# Patient Record
Sex: Female | Born: 2000
Health system: Southern US, Community
[De-identification: ages and names within clinical notes are randomized; demographics above are authoritative.]

## PROBLEM LIST (undated history)

## (undated) ENCOUNTER — Inpatient Hospital Stay (HOSPITAL_COMMUNITY): Payer: Self-pay

## (undated) DIAGNOSIS — IMO0002 Reserved for concepts with insufficient information to code with codable children: Secondary | ICD-10-CM

## (undated) DIAGNOSIS — Z68.41 Body mass index (BMI) pediatric, greater than or equal to 95th percentile for age: Secondary | ICD-10-CM

## (undated) HISTORY — PX: NO PAST SURGERIES: SHX2092

---
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
## 2018-03-18 ENCOUNTER — Telehealth: Payer: Self-pay | Admitting: *Deleted

## 2018-03-18 NOTE — Telephone Encounter (Signed)
Melissa -- please advise?  Copied from CRM [id number]. Topic: Appointment Scheduling - Scheduling Inquiry for Clinic >> Mar 18, 2018 10:04 AM Herby Abraham C wrote: Reason for CRM: Reason for CRM: pt's mom called in to schedule an established care apt with [facility] on 04/21/18, she would like to know if Melissa could see both of her daughters (also Lelon Mast, CRM also sent) She would like to know if provider could get them both established on the same day? I did advise that provider only see 1 np a day, please assist with scheduling.   Pt is looking at 04/21/18 at 11:20 and 11:40a.  Mom's name is Alvis Lemmings [phone]    Mom says that Lelon Mast also has a concern with her toe. Informed that I would make provider aware.

## 2018-03-31 ENCOUNTER — Encounter (HOSPITAL_COMMUNITY): Payer: Self-pay | Admitting: Emergency Medicine

## 2018-03-31 ENCOUNTER — Telehealth (HOSPITAL_COMMUNITY): Payer: Self-pay | Admitting: Emergency Medicine

## 2018-03-31 ENCOUNTER — Ambulatory Visit (HOSPITAL_COMMUNITY)
Admission: EM | Admit: 2018-03-31 | Discharge: 2018-03-31 | Disposition: A | Discharge status: Home / Self Care | Payer: 59 | Attending: Emergency Medicine | Admitting: Emergency Medicine

## 2018-03-31 ENCOUNTER — Ambulatory Visit (INDEPENDENT_AMBULATORY_CARE_PROVIDER_SITE_OTHER): Payer: 59

## 2018-03-31 DIAGNOSIS — R197 Diarrhea, unspecified: Secondary | ICD-10-CM

## 2018-03-31 DIAGNOSIS — J189 Pneumonia, unspecified organism: Secondary | ICD-10-CM

## 2018-03-31 DIAGNOSIS — R0602 Shortness of breath: Secondary | ICD-10-CM | POA: Diagnosis not present

## 2018-03-31 DIAGNOSIS — R0981 Nasal congestion: Secondary | ICD-10-CM

## 2018-03-31 DIAGNOSIS — R05 Cough: Secondary | ICD-10-CM

## 2018-03-31 HISTORY — DX: Body mass index (BMI) pediatric, greater than or equal to 95th percentile for age: Z68.54

## 2018-03-31 HISTORY — DX: Reserved for concepts with insufficient information to code with codable children: IMO0002

## 2018-03-31 LAB — POCT I-STAT, CHEM 8
BUN: 8 mg/dL (ref 6–20)
CALCIUM ION: 1.2 mmol/L (ref 1.15–1.40)
CHLORIDE: 106 mmol/L (ref 101–111)
Creatinine, Ser: 0.8 mg/dL (ref 0.44–1.00)
Glucose, Bld: 92 mg/dL (ref 65–99)
HCT: 44 % (ref 36.0–46.0)
Hemoglobin: 15 g/dL (ref 12.0–15.0)
Potassium: 4.1 mmol/L (ref 3.5–5.1)
SODIUM: 140 mmol/L (ref 135–145)
TCO2: 22 mmol/L (ref 22–32)

## 2018-03-31 MED ORDER — AZITHROMYCIN 500 MG PO TABS: 500.0000 mg | ORAL_TABLET | Freq: Every day | ORAL | 0 refills | Status: AC

## 2018-03-31 MED ORDER — AEROCHAMBER PLUS MISC: 2 refills | Status: DC

## 2018-03-31 MED ORDER — IBUPROFEN 600 MG PO TABS: 600.0000 mg | ORAL_TABLET | Freq: Four times a day (QID) | ORAL | 0 refills | Status: DC | PRN

## 2018-03-31 MED ORDER — PREDNISONE 20 MG PO TABS
60.0000 mg | ORAL_TABLET | Freq: Once | ORAL | Status: AC
  Administered 2018-03-31: 60 mg via ORAL

## 2018-03-31 MED ORDER — DIPHENOXYLATE-ATROPINE 2.5-0.025 MG PO TABS: 1.0000 | ORAL_TABLET | Freq: Four times a day (QID) | ORAL | 0 refills | Status: DC | PRN

## 2018-03-31 MED ORDER — ALBUTEROL SULFATE HFA 108 (90 BASE) MCG/ACT IN AERS: 2.0000 | INHALATION_SPRAY | RESPIRATORY_TRACT | 0 refills | Status: DC | PRN

## 2018-03-31 MED ORDER — IPRATROPIUM-ALBUTEROL 0.5-2.5 (3) MG/3ML IN SOLN
3.0000 mL | Freq: Once | RESPIRATORY_TRACT | Status: AC
  Administered 2018-03-31: 3 mL via RESPIRATORY_TRACT

## 2018-03-31 MED ORDER — PREDNISONE 20 MG PO TABS: 40.0000 mg | ORAL_TABLET | Freq: Every day | ORAL | 0 refills | Status: AC

## 2018-03-31 MED ORDER — PREDNISONE 20 MG PO TABS
ORAL_TABLET | ORAL | Status: AC
  Filled 2018-03-31: qty 3

## 2018-03-31 MED ORDER — IPRATROPIUM-ALBUTEROL 0.5-2.5 (3) MG/3ML IN SOLN
RESPIRATORY_TRACT | Status: AC
  Filled 2018-03-31: qty 3

## 2018-03-31 NOTE — ED Provider Notes (Addendum)
HPI  SUBJECTIVE:  Jody Reed is a 17 y.o. female who presents with 2 complaints.  First she reports a 2-1/2 to 3-week cough productive of green phlegm.  She reports wheezing, chest pain and soreness secondary to the cough.  She reports shortness of breath starting this morning.  States that she feels like she cannot get a full breath of air in.  She reports nasal congestion and rhinorrhea worse at night.  She reports postnasal drip and sinus pressure with lying down.  No sinus pain.  No allergy, GERD symptoms.  No antibiotics in the past month.  No antipyretic in the past 6 to 8 hours.  She has tried over-the-counter meds including Mucinex and Robitussin.  No phenylephrine-containing products.  No alleviating factors.  Symptoms are worse in the morning and with exertion.  Second, she reports watery, nonbloody diarrhea about 10 episodes a day for the past 3 days.  She reports an 8 pound weight loss.  No nausea, vomiting, fevers.  She reports mild crampy diffuse gassy abdominal pain before having a bowel movement which resolves afterwards.  No abdominal distention.  No lightheadedness, dizziness.  No raw or undercooked foods, questionable leftovers, recent travel, camping, antibiotics in the past month.  No contacts with similar symptoms.  The diarrhea is not associated with eating.  She tried bland foods without improvement in her symptoms.  No aggravating factors.  Past medical history negative for asthma, emphysema, COPD, smoking, diabetes, abdominal surgeries, hypertension, ulcerative colitis, irritable bowel syndrome, Crohn's.  LMP: Now.  PMD:O'Sullivan, Efraim KaufmannMelissa, NP   Past Medical History:  Diagnosis Date  . BMI (body mass index), pediatric, 95-99% for age     History reviewed. No pertinent surgical history.  Family History  Problem Relation Age of Onset  . Vision loss Father   . Diabetes Maternal Grandmother   . Heart disease Maternal Grandmother   . Hyperlipidemia Maternal Grandmother    . Hypertension Maternal Grandmother   . Miscarriages / Stillbirths Maternal Grandmother   . Stroke Maternal Grandfather   . Diabetes Paternal Grandfather   . Heart disease Paternal Grandfather   . Hyperlipidemia Paternal Grandfather   . Hypertension Paternal Grandfather   . Asthma Paternal Uncle   . Alcohol abuse Neg Hx   . Arthritis Neg Hx   . Birth defects Neg Hx   . Cancer Neg Hx   . COPD Neg Hx   . Depression Neg Hx   . Drug abuse Neg Hx   . Early death Neg Hx   . Hearing loss Neg Hx   . Kidney disease Neg Hx   . Learning disabilities Neg Hx   . Mental illness Neg Hx   . Mental retardation Neg Hx   . Varicose Veins Neg Hx     Social History   Tobacco Use  . Smoking status: Never Smoker  . Smokeless tobacco: Never Used  Substance Use Topics  . Alcohol use: No  . Drug use: No    No current facility-administered medications for this encounter.   Current Outpatient Medications:  .  azithromycin (ZITHROMAX) 500 MG tablet, Take 1 tablet (500 mg total) by mouth daily for 5 days., Disp: 5 tablet, Rfl: 0 .  diphenoxylate-atropine (LOMOTIL) 2.5-0.025 MG tablet, Take 1 tablet by mouth 4 (four) times daily as needed for diarrhea or loose stools., Disp: 30 tablet, Rfl: 0 .  ibuprofen (ADVIL,MOTRIN) 600 MG tablet, Take 1 tablet (600 mg total) by mouth every 6 (six) hours as needed., Disp:  30 tablet, Rfl: 0 .  predniSONE (DELTASONE) 20 MG tablet, Take 2 tablets (40 mg total) by mouth daily with breakfast for 5 days., Disp: 10 tablet, Rfl: 0 .  Spacer/Aero-Holding Chambers (AEROCHAMBER PLUS) inhaler, Use as instructed, Disp: 1 each, Rfl: 2  No Known Allergies   ROS  As noted in HPI.   Physical Exam  BP 120/76   Pulse 68   Temp 98.1 F (36.7 C) (Oral)   Resp 18   Ht 5\' 6"  (1.676 m)   Wt 225 lb (102.1 kg)   LMP 03/27/2018   SpO2 98%   BMI 36.32 kg/m   Constitutional: Well developed, well nourished, no acute distress Eyes: PERRL, EOMI, conjunctiva normal  bilaterally HENT: Normocephalic, atraumatic,mucus membranes moist. no nasal congestion.  Normal turbinates.  No sinus tenderness.  Unable to completely visualize oropharynx. Respiratory: Fair air movement, rhonchi and wheezing that clear with coughing.  No chest wall tenderness Cardiovascular: Normal rate and rhythm, no murmurs, no gallops, no rubs GI: Soft, nondistended, normal bowel sounds, nontender, no rebound, no guarding.  Negative Murphy, negative McBurney. skin: No rash, skin intact. cap refill less than 2 seconds Musculoskeletal: No edema, no tenderness, no deformities Neurologic: Alert & oriented x 3, CN II-XII grossly intact, no motor deficits, sensation grossly intact Psychiatric: Speech and behavior appropriate   ED Course   Medications  ipratropium-albuterol (DUONEB) 0.5-2.5 (3) MG/3ML nebulizer solution 3 mL (3 mLs Nebulization Given 03/31/18 1106)  predniSONE (DELTASONE) tablet 60 mg (60 mg Oral Given 03/31/18 1106)    Orders Placed This Encounter  Procedures  . DG Chest 2 View    Standing Status:   Standing    Number of Occurrences:   1    Order Specific Question:   Reason for Exam (SYMPTOM  OR DIAGNOSIS REQUIRED)    Answer:   cough r/o PNA effusion pulm edema  . I-STAT, chem 8    Standing Status:   Standing    Number of Occurrences:   1   Results for orders placed or performed during the hospital encounter of 03/31/18 (from the past 24 hour(s))  I-STAT, chem 8     Status: None   Collection Time: 03/31/18 11:17 AM  Result Value Ref Range   Sodium 140 135 - 145 mmol/L   Potassium 4.1 3.5 - 5.1 mmol/L   Chloride 106 101 - 111 mmol/L   BUN 8 6 - 20 mg/dL   Creatinine, Ser 1.61 0.44 - 1.00 mg/dL   Glucose, Bld 92 65 - 99 mg/dL   Calcium, Ion 0.96 1.15 - 1.40 mmol/L   TCO2 22 22 - 32 mmol/L   Hemoglobin 15.0 12.0 - 15.0 g/dL   HCT 04.5 40.9 - 81.1 %   Dg Chest 2 View  Result Date: 03/31/2018 CLINICAL DATA:  Cough for 3 weeks, shortness of breath starting this  morning with diarrhea. EXAM: CHEST - 2 VIEW COMPARISON:  None. FINDINGS: Cardiomediastinal silhouette is within normal limits in size and configuration. Lungs are clear. Lung volumes are normal. No evidence of pneumonia. No pleural effusion. No pneumothorax seen. Osseous and soft tissue structures about the chest are unremarkable. IMPRESSION: No active cardiopulmonary disease. No evidence of pneumonia or pulmonary edema. Electronically Signed   By: Bary Richard M.D.   On: 03/31/2018 11:03    ED Clinical Impression  Diarrhea, unspecified type  Community acquired pneumonia, unspecified laterality   ED Assessment/Plan   Patient appears nontoxic, well-hydrated.  Her vitals are normal, abdomen benign.  Will check an i-STAT given the duration and reported amount of diarrhea.  We will also check a chest x-ray because of the focal lung findings.  Giving prednisone 60 mg and a DuoNeb x1.  Patient denies phenylephrine use.  If these are negative, will treat as a community-acquired pneumonia and an infectious diarrhea.  Plan to send home with albuterol inhaler with a spacer.  2 puffs every 4-6 hours.  Prednisone 40 mg for 4 days.  Azithromycin 500 mg for 5 days which will cover both an infectious diarrhea and a pneumonia.  Advised Lomotil and pushing electrolyte containing fluids.  Reviewed imaging independently.  No pneumonia, pulmonary edema.  See radiology report for full details.  I-STAT normal.  On re-Examination, patient states that she feels slightly better.  Lungs are clear to auscultation.  Plan as above.  Discussed labs, imaging, MDM, treatment plan, and plan for follow-up with parent and patient. Discussed sn/sx that should prompt return to the ED. parent and patient agrees with plan.   Meds ordered this encounter  Medications  . ipratropium-albuterol (DUONEB) 0.5-2.5 (3) MG/3ML nebulizer solution 3 mL  . predniSONE (DELTASONE) tablet 60 mg  . ibuprofen (ADVIL,MOTRIN) 600 MG tablet     Sig: Take 1 tablet (600 mg total) by mouth every 6 (six) hours as needed.    Dispense:  30 tablet    Refill:  0  . predniSONE (DELTASONE) 20 MG tablet    Sig: Take 2 tablets (40 mg total) by mouth daily with breakfast for 5 days.    Dispense:  10 tablet    Refill:  0  . azithromycin (ZITHROMAX) 500 MG tablet    Sig: Take 1 tablet (500 mg total) by mouth daily for 5 days.    Dispense:  5 tablet    Refill:  0  . diphenoxylate-atropine (LOMOTIL) 2.5-0.025 MG tablet    Sig: Take 1 tablet by mouth 4 (four) times daily as needed for diarrhea or loose stools.    Dispense:  30 tablet    Refill:  0  . Spacer/Aero-Holding Chambers (AEROCHAMBER PLUS) inhaler    Sig: Use as instructed    Dispense:  1 each    Refill:  2    *This clinic note was created using Dragon dictation software. Therefore, there may be occasional mistakes despite careful proofreading.  ?   Domenick Gong, MD 03/31/18 1153    Domenick Gong, MD 03/31/18 1153

## 2018-03-31 NOTE — Discharge Instructions (Addendum)
Push electrolyte containing fluids such as Pedialyte or Gatorade.  2 puffs from your albuterol inhaler every 4-6 hours as needed for coughing, wheezing.  Use the spacer.  Finish the antibiotics, even if you feel better.  600 mg ibuprofen with 1 g of Tylenol 3-4 times a day together as needed for pain.  Start prednisone tomorrow you have taken today's dose already.

## 2018-03-31 NOTE — ED Triage Notes (Signed)
PT reports cough for 2.5 weeks, SOB for 2 days. Diarrhea for 1.5 days. PT reports more than 15 episodes of diarrhea per day. No nausea

## 2018-04-21 ENCOUNTER — Ambulatory Visit: Payer: Self-pay | Admitting: Family

## 2018-04-29 ENCOUNTER — Telehealth: Payer: Self-pay | Admitting: Family

## 2018-04-29 ENCOUNTER — Ambulatory Visit: Payer: Self-pay | Admitting: Family

## 2018-04-29 NOTE — Telephone Encounter (Signed)
Melissa -- ok to r/s with you?

## 2018-04-29 NOTE — Telephone Encounter (Signed)
Called pt and left message that provider asked pt to reschedule with different provider.

## 2018-04-29 NOTE — Telephone Encounter (Signed)
Per verbal from PCP, please reschedule with a different Provider.

## 2018-04-29 NOTE — Telephone Encounter (Signed)
Copied from CRM [id number]. Topic: Appointment Scheduling - New Patient >> Apr 29, 2018  9:44 AM Raquel SarnaHayes, Jody G wrote: No Show new pt appt today w/ Sandford CrazeMelissa O'Sullivan.  Pt wrote the appt for the wrong day and missed today's appt.  Pt wants to reschedule her appt.  Pleaes call Mom back to reschedule her appt. Pt cannot make the week of July 22 due to being out of town.  Pt will be going back to school Aug 16th.

## 2018-05-08 ENCOUNTER — Ambulatory Visit (INDEPENDENT_AMBULATORY_CARE_PROVIDER_SITE_OTHER): Payer: 59 | Admitting: Medical

## 2018-05-08 ENCOUNTER — Encounter: Payer: Self-pay | Admitting: Medical

## 2018-05-08 VITALS — BP 119/51 | HR 81 | Temp 97.8°F | Resp 16 | Ht 66.0 in | Wt 236.2 lb

## 2018-05-08 DIAGNOSIS — Z0000 Encounter for general adult medical examination without abnormal findings: Secondary | ICD-10-CM

## 2018-05-08 NOTE — Patient Instructions (Addendum)
For you wellness exam today. No concerns on review. Labs not done today.(but offered as screening)  Vaccine that are required appear up to date.  Men B vaccine not on vaccine history list.. If you want to get just let me know and can give.(could schedule as nurse visit)  Recommend exercise and healthy diet.  We will let you know lab results as they come in.  Follow up date appointment will be determined after lab review.    Preventive Care 18-39 Years, Female Preventive care refers to lifestyle choices and visits with your health care provider that can promote health and wellness. What does preventive care include?  A yearly physical exam. This is also called an annual well check.  Dental exams once or twice a year.  Routine eye exams. Ask your health care provider how often you should have your eyes checked.  Personal lifestyle choices, including: ? Daily care of your teeth and gums. ? Regular physical activity. ? Eating a healthy diet. ? Avoiding tobacco and drug use. ? Limiting alcohol use. ? Practicing safe sex. ? Taking vitamin and mineral supplements as recommended by your health care provider. What happens during an annual well check? The services and screenings done by your health care provider during your annual well check will depend on your age, overall health, lifestyle risk factors, and family history of disease. Counseling Your health care provider may ask you questions about your:  Alcohol use.  Tobacco use.  Drug use.  Emotional well-being.  Home and relationship well-being.  Sexual activity.  Eating habits.  Work and work Statistician.  Method of birth control.  Menstrual cycle.  Pregnancy history.  Screening You may have the following tests or measurements:  Height, weight, and BMI.  Diabetes screening. This is done by checking your blood sugar (glucose) after you have not eaten for a while (fasting).  Blood pressure.  Lipid and  cholesterol levels. These may be checked every 5 years starting at age 47.  Skin check.  Hepatitis C blood test.  Hepatitis B blood test.  Sexually transmitted disease (STD) testing.  BRCA-related cancer screening. This may be done if you have a family history of breast, ovarian, tubal, or peritoneal cancers.  Pelvic exam and Pap test. This may be done every 3 years starting at age 58. Starting at age 43, this may be done every 5 years if you have a Pap test in combination with an HPV test.  Discuss your test results, treatment options, and if necessary, the need for more tests with your health care provider. Vaccines Your health care provider may recommend certain vaccines, such as:  Influenza vaccine. This is recommended every year.  Tetanus, diphtheria, and acellular pertussis (Tdap, Td) vaccine. You may need a Td booster every 10 years.  Varicella vaccine. You may need this if you have not been vaccinated.  HPV vaccine. If you are 69 or younger, you may need three doses over 6 months.  Measles, mumps, and rubella (MMR) vaccine. You may need at least one dose of MMR. You may also need a second dose.  Pneumococcal 13-valent conjugate (PCV13) vaccine. You may need this if you have certain conditions and were not previously vaccinated.  Pneumococcal polysaccharide (PPSV23) vaccine. You may need one or two doses if you smoke cigarettes or if you have certain conditions.  Meningococcal vaccine. One dose is recommended if you are age 50-21 years and a first-year college student living in a residence hall, or if you have  one of several medical conditions. You may also need additional booster doses.  Hepatitis A vaccine. You may need this if you have certain conditions or if you travel or work in places where you may be exposed to hepatitis A.  Hepatitis B vaccine. You may need this if you have certain conditions or if you travel or work in places where you may be exposed to hepatitis  B.  Haemophilus influenzae type b (Hib) vaccine. You may need this if you have certain risk factors.  Talk to your health care provider about which screenings and vaccines you need and how often you need them. This information is not intended to replace advice given to you by your health care provider. Make sure you discuss any questions you have with your health care provider. Document Released: 12/10/2001 Document Revised: 07/03/2016 Document Reviewed: 08/15/2015 Elsevier Interactive Patient Education  2018 [place] Meningococcal Vaccine (MenB): What You Need to Know 1. Why get vaccinated? Meningococcal disease is a serious illness caused by a type of bacteria called Neisseria meningitidis. It can lead to meningitis (infection of the lining of the brain and spinal cord) and infections of the blood. Meningococcal disease often occurs without warning-even among people who are otherwise healthy. Meningococcal disease can spread from person to person through close contact (coughing or kissing) or lengthy contact, especially among people living in the same household. There are at least 12 types of N. meningitidis, called "serogroups." Serogroups A, B, C, W, and Y cause most meningococcal disease. Anyone can get meningococcal disease but certain people are at increased risk, including: Infants younger than one year old Adolescents and young adults 42 through 19 years old People with certain medical conditions that affect the immune system Microbiologists who routinely work with isolates of N. meningitidis People at risk because of an outbreak in their community  Even when it is treated, meningococcal disease kills 10 to 15 infected people out of 100. And of those who survive, about 10 to 20 out of every 100 will suffer disabilities such as hearing loss, brain damage, kidney damage, amputations, nervous system problems, or severe scars from skin grafts. Serogroup B meningococcal  (MenB) vaccines can help prevent meningococcal disease caused by serogroup B. Other meningococcal vaccines are recommended to help protect against serogroups A, C, W, and Y. 2. Serogroup B meningococcal vaccines Two serogroup B meningococcal vaccines-Bexsero and Trumenba-have been licensed by the [employer] and Drug Administration (FDA). These vaccines are recommended routinely for people 10 years or older who are at increased risk for serogroup B meningococcal infections, including: People at risk because of a serogroup B meningococcal disease outbreak Anyone whose spleen is damaged or has been removed Anyone with a rare immune system condition called "persistent complement component deficiency" Anyone taking a drug called eculizumab (also called Soliris) Microbiologists who routinely work with isolates of N. meningitidis  These vaccines may also be given to anyone 62 through 19 years old to provide short term protection against most strains of serogroup B meningococcal disease; 16 through 18 years are the preferred ages for vaccination. For best protection, more than 1 dose of a serogroup B meningococcal vaccine is needed. The same vaccine must be used for all doses. Ask your health care provider about the number and timing of doses. 3. Some people should not get these vaccines Tell the person who is giving you the vaccine: If you have any severe, life-threatening allergies. If you have ever had a life-threatening allergic  reaction after a previous dose of serogroup B meningococcal vaccine, or if you have a severe allergy to any part of this vaccine, you should not get the vaccine. Tell your health care provider if you any severe allergies that you know of, including a severe allergy to latex. He or she can tell you about the vaccine's ingredients. If you are pregnant or breastfeeding. There is not very much information about the potential risks of this vaccine for a pregnant woman or breastfeeding  mother. It should be used during pregnancy only if clearly needed.  If you have a mild illness, such as a cold, you can probably get the vaccine today. If you are moderately or severely ill, you should probably wait until you recover. Your doctor can advise you. 4. Risks of a vaccine reaction With any medicine, including vaccines, there is a chance of reactions. These are usually mild and go away on their own within a few days, but serious reactions are also possible. More than half of the people who get serogroup B meningococcal vaccine have mild problems following vaccination. These reactions can last up to 3 to 7 days, and include: Soreness, redness, or swelling where the shot was given Tiredness or fatigue Headache Muscle or joint pain Fever or chills Nausea or diarrhea  Other problems that could happen after these vaccines: People sometimes faint after a medical procedure, including vaccination. Sitting or lying down for about 15 minutes can help prevent fainting, and injuries caused by a fall. Tell your provider if you feel dizzy, or have vision changes or ringing in the ears. Some people get shoulder pain that can be more severe and longer-lasting than the more routine soreness that can follow injections This happens very rarely. Any medication can cause a severe allergic reaction. Such reactions from a vaccine are very rare, estimated at about 1 in a million doses, and would happen within a few minutes to a few hours after the vaccination. As with any medicine, there is a very remote chance of a vaccine causing a serious injury or death. The safety of vaccines is always being monitored. For more information, visit: http://www.aguilar.org/ 5. What if there is a serious reaction? What should I look for? Look for anything that concerns you, such as signs of a severe allergic reaction, very high fever, or unusual behavior. Signs of a severe allergic reaction can include hives, swelling  of the face and throat, difficulty breathing, a fast heartbeat, dizziness, and weakness. These would usually start a few minutes to a few hours after the vaccination. What should I do? If you think it is a severe allergic reaction or other emergency that can't wait, call 9-1-1 and get to the nearest hospital. Otherwise, call your clinic. Afterward the reaction should be reported to the Vaccine Adverse Event Reporting System (VAERS). Your doctor should file this report, or you can do it yourself through the VAERS web site at www.vaers.SamedayNews.es, or by calling 417 582 8686. VAERS does not give medical advice. 6. The National Vaccine Injury Compensation Program The [employer] Vaccine Injury Compensation Program (VICP) is a federal program that was created to compensate people who may have been injured by certain vaccines. Persons who believe they may have been injured by a vaccine can learn about the program and about filing a claim by calling (587) 645-1143 or visiting the [place] website at GoldCloset.com.ee. There is a time limit to file a claim for compensation. 7. How can I learn more? Ask your health care provider. He  or she can give you the vaccine package insert or suggest other sources of information. Call your local or state health department. Contact the Centers for Disease Control and Prevention (CDC): Call (905)672-0187 (1-800-CDC-INFO) or Visit CDC's website at http://hunter.com/ Vaccine Information Statement, Serogroup B Meningococcal Vaccine (06/06/2015) This information is not intended to replace advice given to you by your health care provider. Make sure you discuss any questions you have with your health care provider. Document Released: 06/17/2014 Document Revised: 07/04/2016 Document Reviewed: 07/04/2016 Elsevier Interactive Patient Education  2017 [employer].

## 2018-05-08 NOTE — Progress Notes (Signed)
Subjective:    Patient ID: Jody Reed, female    DOB: [birth year], 17 y.o.   MRN: [id number]  HPI  Pt in for first.  To get established. Pt report no chronic medical problems. No chronic meds  Pt attends [employer]. Criminal Justice and Pre-law.  Pt exercises. Swims labs 30-45 minutes a day. Occasional coffee or tea. Moderate healthy diet    LMP-2 weeks ago.   Negative review of systems.    Review of Systems  Constitutional: Negative for chills, fatigue and fever.  HENT: Negative for congestion and dental problem.   Respiratory: Negative for cough, chest tightness, shortness of breath and wheezing.   Cardiovascular: Negative for chest pain and palpitations.  Gastrointestinal: Negative for abdominal pain.  Genitourinary: Negative for difficulty urinating, dysuria, flank pain and frequency.  Musculoskeletal: Negative for back pain.  Skin: Negative for rash.  Neurological: Negative for dizziness, speech difficulty, weakness, numbness and headaches.  Hematological: Negative for adenopathy. Does not bruise/bleed easily.  Psychiatric/Behavioral: Negative for behavioral problems, confusion and sleep disturbance. The patient is not nervous/anxious.     Past Medical History:  Diagnosis Date  . BMI (body mass index), pediatric, 95-99% for age      Social History   Socioeconomic History  . Marital status: Single    Spouse name: Not on file  . Number of children: Not on file  . Years of education: Not on file  . Highest education level: Not on file  Occupational History  . Not on file  Social Needs  . Financial resource strain: Not on file  . Food insecurity:    Worry: Not on file    Inability: Not on file  . Transportation needs:    Medical: Not on file    Non-medical: Not on file  Tobacco Use  . Smoking status: Never Smoker  . Smokeless tobacco: Never Used  Substance and Sexual Activity  . Alcohol use: No  . Drug use: No  . Sexual activity: Never    Birth  control/protection: Abstinence  Lifestyle  . Physical activity:    Days per week: Not on file    Minutes per session: Not on file  . Stress: Not on file  Relationships  . Social connections:    Talks on phone: Not on file    Gets together: Not on file    Attends religious service: Not on file    Active member of club or organization: Not on file    Attends meetings of clubs or organizations: Not on file    Relationship status: Not on file  . Intimate partner violence:    Fear of current or ex partner: Not on file    Emotionally abused: Not on file    Physically abused: Not on file    Forced sexual activity: Not on file  Other Topics Concern  . Not on file  Social History Narrative   Lives at home with Mom, Dad, and Sister   Has 1 dog   Plays vollyeball, does crossfit    History reviewed. No pertinent surgical history.  Family History  Problem Relation Age of Onset  . Vision loss Father   . Diabetes Maternal Grandmother   . Heart disease Maternal Grandmother   . Hyperlipidemia Maternal Grandmother   . Hypertension Maternal Grandmother   . Miscarriages / Stillbirths Maternal Grandmother   . Stroke Maternal Grandfather   . Diabetes Paternal Grandfather   . Heart disease Paternal Grandfather   . Hyperlipidemia Paternal  Grandfather   . Hypertension Paternal Grandfather   . Asthma Paternal Uncle   . Alcohol abuse Neg Hx   . Arthritis Neg Hx   . Birth defects Neg Hx   . Cancer Neg Hx   . COPD Neg Hx   . Depression Neg Hx   . Drug abuse Neg Hx   . Early death Neg Hx   . Hearing loss Neg Hx   . Kidney disease Neg Hx   . Learning disabilities Neg Hx   . Mental illness Neg Hx   . Mental retardation Neg Hx   . Varicose Veins Neg Hx     No Known Allergies  No current outpatient medications on file prior to visit.   No current facility-administered medications on file prior to visit.     BP (!) 119/51   Pulse 81   Temp 97.8 F (36.6 C) (Oral)   Resp 16   Ht 5'  6" (1.676 m)   Wt 236 lb 3.2 oz (107.1 kg)   SpO2 99%   BMI 38.12 kg/m      Objective:   Physical Exam  General Mental Status- Alert. General Appearance- Not in acute distress.    Chest and Lung Exam Auscultation: Breath Sounds:-Normal.  Cardiovascular Auscultation:Rythm- Regular. Murmurs & Other Heart Sounds:Auscultation of the heart reveals- No Murmurs.  Abdomen Inspection:-Inspeection Normal. Palpation/Percussion:Note:No mass. Palpation and Percussion of the abdomen reveal- Non Tender, Non Distended + BS, no rebound or guarding.   Neurologic Cranial Nerve exam:- CN III-XII intact(No nystagmus), symmetric smile. Strength:- 5/5 equal and symmetric strength both upper and lower extremities.      Assessment & Plan:  For you wellness exam today. No concerns on review. Labs not done today.(but offered as screening)  Vaccine that are required appear up to date.  Men B vaccine not on vaccine history list.. If you want to get just let me know and can give.  Note did explain to patient and mother that meningitis b vaccine did become required vaccine this year.  (But it appears that she started college before that was actually in place or enforced.)  Recommend exercise and healthy diet.  We will let you know lab results as they come in.  Follow up date appointment will be determined after lab review.   Esperanza Richters, PA-C

## 2018-06-22 ENCOUNTER — Telehealth: Payer: Self-pay | Admitting: Medical

## 2018-06-22 NOTE — Telephone Encounter (Signed)
Pt mom came into office states daughter has all the symptoms of UTI. She is has tried over the counter AZO, cranberry, water, and no fever just painful urination. Will you please call her an antibiotic into [facility] [address] [place] [phone]? Pt is currently at college and can't come into office. Thanks.

## 2018-06-22 NOTE — Telephone Encounter (Signed)
I want to be helpful but as pcp can treat patient for condition when in town. If she has acute illness out of town and at her university she should be see by student health or local urgent care.  I know this is not what they want to hear but I typically want the urine to test, do culture to prove infection and prescribe antibiotic pending result of urine culture.  As final option if they don't want to go to see someone local can they do e-visit? I think e-visit are for sinus infection,uti.(list of limited conditions). If so notify patient e- visit is option. But this is not with me. This is done through other provider.

## 2018-06-23 NOTE — Telephone Encounter (Signed)
Left pt a message to call back. 

## 2018-06-23 NOTE — Telephone Encounter (Signed)
Pt returned call, advised per message below. Pt expressed understanding and stated that she is going to call her mom because she may be able to come into town for a ov. Pt says if need be she will give us a call back.

## 2018-10-26 ENCOUNTER — Telehealth: Payer: Self-pay | Admitting: Medical

## 2018-10-26 NOTE — Telephone Encounter (Signed)
Patient would like to request to change providers from [facility] to [facility]. Pt stated that she would prefer a female provider. Would this be ok with the providers?

## 2018-10-26 NOTE — Telephone Encounter (Signed)
Ok with me 

## 2018-10-26 NOTE — Telephone Encounter (Signed)
I saw her one time. Ok to switch provided [facility] KaufmannMelissa accepts. [place] location also option if no female provider in our office available.

## 2018-10-29 NOTE — Telephone Encounter (Signed)
Called and spoke to pt and informed her that she was approved to see [name] as a TOC pt. Pt stated that she is away at school and would call back when she knew when her break would be.

## 2019-04-28 DIAGNOSIS — Z01419 Encounter for gynecological examination (general) (routine) without abnormal findings: Secondary | ICD-10-CM | POA: Diagnosis not present

## 2019-04-28 DIAGNOSIS — N762 Acute vulvitis: Secondary | ICD-10-CM | POA: Diagnosis not present

## 2019-04-28 DIAGNOSIS — Z113 Encounter for screening for infections with a predominantly sexual mode of transmission: Secondary | ICD-10-CM | POA: Diagnosis not present

## 2019-05-26 ENCOUNTER — Other Ambulatory Visit: Payer: Self-pay

## 2019-05-26 ENCOUNTER — Ambulatory Visit (INDEPENDENT_AMBULATORY_CARE_PROVIDER_SITE_OTHER): Payer: 59 | Admitting: Family

## 2019-05-26 ENCOUNTER — Encounter: Payer: Self-pay | Admitting: Family

## 2019-05-26 DIAGNOSIS — Z83518 Family history of other specified eye disorder: Secondary | ICD-10-CM

## 2019-05-26 DIAGNOSIS — Z309 Encounter for contraceptive management, unspecified: Secondary | ICD-10-CM | POA: Diagnosis not present

## 2019-05-26 DIAGNOSIS — Z3046 Encounter for surveillance of implantable subdermal contraceptive: Secondary | ICD-10-CM | POA: Diagnosis not present

## 2019-05-26 NOTE — Progress Notes (Signed)
Established Patient Office Visit  Subjective:  Patient ID: Jody Reed, female    DOB: [birth year]  Age: 18 y.o. MRN: [id number]  CC:  Chief Complaint  Patient presents with  . New Patient (Initial Visit)    To stablish care    HPI Jody Reed presents for transfer of care visit from another provider at our office.   Family hx of macular degeneration- dad developed at age 36. Pt is evaluated on an annual basis by ophthalmology. She sees Dr. Ria Comment at [facility].  Patient has a nexplanon in place- had replaced today by GYN (sees Vanessa Kick with [facility].  Reports breakfast is fruit in AM Lunch- sandwiches, salads Rare snacks Dinner- chicken, rice/potatos, vegetable Tries to avoid snacks in the evenings  Drinks- some sweet tea/gatorade.   Wt Readings from Last 3 Encounters:  05/08/18 236 lb 3.2 oz (107.1 kg) (>99 %, Z= 2.36)*  03/31/18 225 lb (102.1 kg) (99 %, Z= 2.25)*  06/13/16 222 lb 4.8 oz (100.8 kg) (99 %, Z= 2.25)*   * Growth percentiles are based on CDC (Girls, 2-20 Years) data.     Past Medical History:  Diagnosis Date  . BMI (body mass index), pediatric, 95-99% for age     History reviewed. No pertinent surgical history.  Family History  Problem Relation Age of Onset  . Vision loss Father        macular degeneration in one eye  . Diabetes Maternal Grandmother   . Heart disease Maternal Grandmother   . Hyperlipidemia Maternal Grandmother   . Hypertension Maternal Grandmother   . Miscarriages / Stillbirths Maternal Grandmother   . Stroke Maternal Grandfather   . Diabetes Paternal Grandfather   . Heart disease Paternal Grandfather   . Hyperlipidemia Paternal Grandfather   . Hypertension Paternal Grandfather   . Asthma Paternal Uncle   . Alcohol abuse Neg Hx   . Arthritis Neg Hx   . Birth defects Neg Hx   . Cancer Neg Hx   . COPD Neg Hx   . Depression Neg Hx   . Drug abuse Neg Hx   . Early death Neg Hx   . Hearing loss Neg  Hx   . Kidney disease Neg Hx   . Learning disabilities Neg Hx   . Mental illness Neg Hx   . Mental retardation Neg Hx   . Varicose Veins Neg Hx     Social History   Socioeconomic History  . Marital status: Single    Spouse name: Not on file  . Number of children: Not on file  . Years of education: Not on file  . Highest education level: Not on file  Occupational History  . Occupation: Ship broker  Social Needs  . Financial resource strain: Not on file  . Food insecurity    Worry: Never true    Inability: Never true  . Transportation needs    Medical: No    Non-medical: No  Tobacco Use  . Smoking status: Never Smoker  . Smokeless tobacco: Never Used  Substance and Sexual Activity  . Alcohol use: No  . Drug use: No  . Sexual activity: Yes    Birth control/protection: Other-see comments, Implant    Comment: Nexplanon  Lifestyle  . Physical activity    Days per week: 3 days    Minutes per session: 40 min  . Stress: Not on file  Relationships  . Social Herbalist on phone: More  than three times a week    Gets together: Once a week    Attends religious service: Never    Active member of club or organization: No    Attends meetings of clubs or organizations: Never    Relationship status: Not on file  . Intimate partner violence    Fear of current or ex partner: No    Emotionally abused: No    Physically abused: No    Forced sexual activity: No  Other Topics Concern  . Not on file  Social History Narrative   Lives at home with Mom, Dad, and Sister   Has 1 dog   Plays volleyball, does crossfit   Goes to [employer] (studying Criminal Justice/pre-law)    No outpatient medications prior to visit.   No facility-administered medications prior to visit.     No Known Allergies     Review of Systems   see HPI   Objective:    Physical Exam    Gen: Awake, alert, no acute distress Resp: Breathing is even and non-labored Psych: calm/pleasant  demeanor Neuro: Alert and Oriented x 3, + facial symmetry, speech is clear.   There were no vitals taken for this visit. Wt Readings from Last 3 Encounters:  05/08/18 236 lb 3.2 oz (107.1 kg) (>99 %, Z= 2.36)*  03/31/18 225 lb (102.1 kg) (99 %, Z= 2.25)*  06/13/16 222 lb 4.8 oz (100.8 kg) (99 %, Z= 2.25)*   * Growth percentiles are based on CDC (Girls, 2-20 Years) data.     Health Maintenance Due  Topic Date Due  . CHLAMYDIA SCREENING  04/11/2014  . HIV Screening  04/11/2014    There are no preventive care reminders to display for this patient.  No results found for: TSH Lab Results  Component Value Date   WBC 10.9 05/06/2011   HGB 15.0 03/31/2018   HCT 44.0 03/31/2018   MCV 84.2 05/06/2011   PLT 249 05/06/2011   Lab Results  Component Value Date   NA 140 03/31/2018   K 4.1 03/31/2018   CO2 23 05/06/2011   GLUCOSE 92 03/31/2018   BUN 8 03/31/2018   CREATININE 0.80 03/31/2018   CALCIUM 9.9 05/06/2011   No results found for: CHOL No results found for: HDL No results found for: LDLCALC No results found for: TRIG No results found for: CHOLHDL No results found for: [id number]    Assessment & Plan:   Problem List Items Addressed This Visit    None    Visit Diagnoses    Morbid obesity (HCC)    -  Primary     We discussed importance of healthy diet including lean proteins and avoiding sugared beverages/watching portions- especially of white carbs. Continue regular exercise. Discussed that her weight is likely her biggest health issue at this time.     Contraceptive management- has nexplanon, placed today by GYN.   Family hx of macular degeneration- encouraged pt to continue her annual screening exams.  No orders of the defined types were placed in this encounter.   Follow-up: No follow-ups on file.    Lemont FillersMelissa S O'Sullivan, NP

## 2019-08-25 MED FILL — IBUPROFEN 600 MG TABLET: 600 | 6 days supply | Qty: 18 | Fill #0

## 2019-08-25 MED FILL — AMOXICILLIN 500 MG CAPSULE: 500 | 1 days supply | Qty: 2 | Fill #0

## 2019-08-25 MED FILL — CHLORHEXIDINE 0.12% RINSE: 0.12 | 16 days supply | Qty: 473 | Fill #0

## 2019-09-27 MED FILL — HYDROCODON-APAP 5-325: 5-325 | 4 days supply | Qty: 16 | Fill #0

## 2020-02-20 IMAGING — DX DG CHEST 2V
2 series · 2 of 2 positions shown · non-contrast
Comparison: None.

CLINICAL DATA: Cough for 3 weeks, shortness of breath starting this
morning with diarrhea.

EXAM:
CHEST - 2 VIEW

[chest pa]
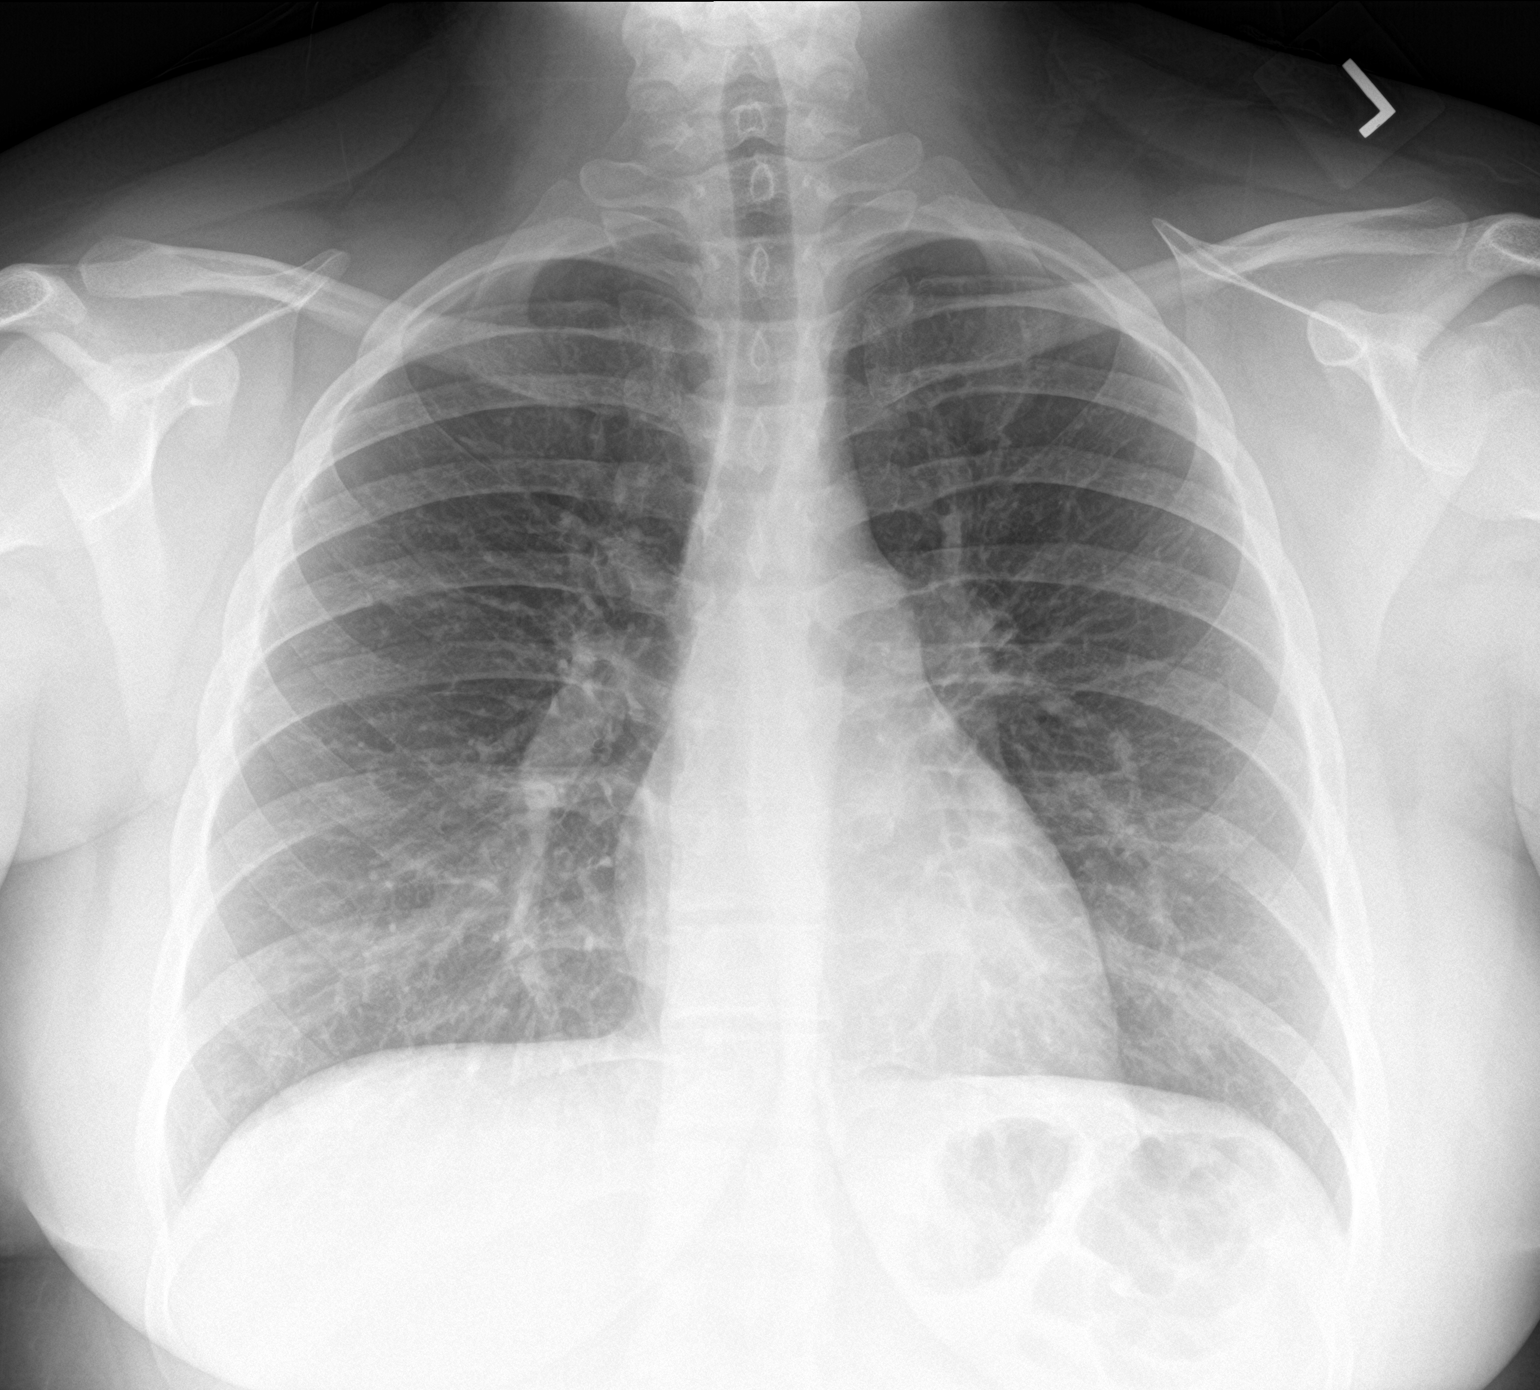

[chest lat]
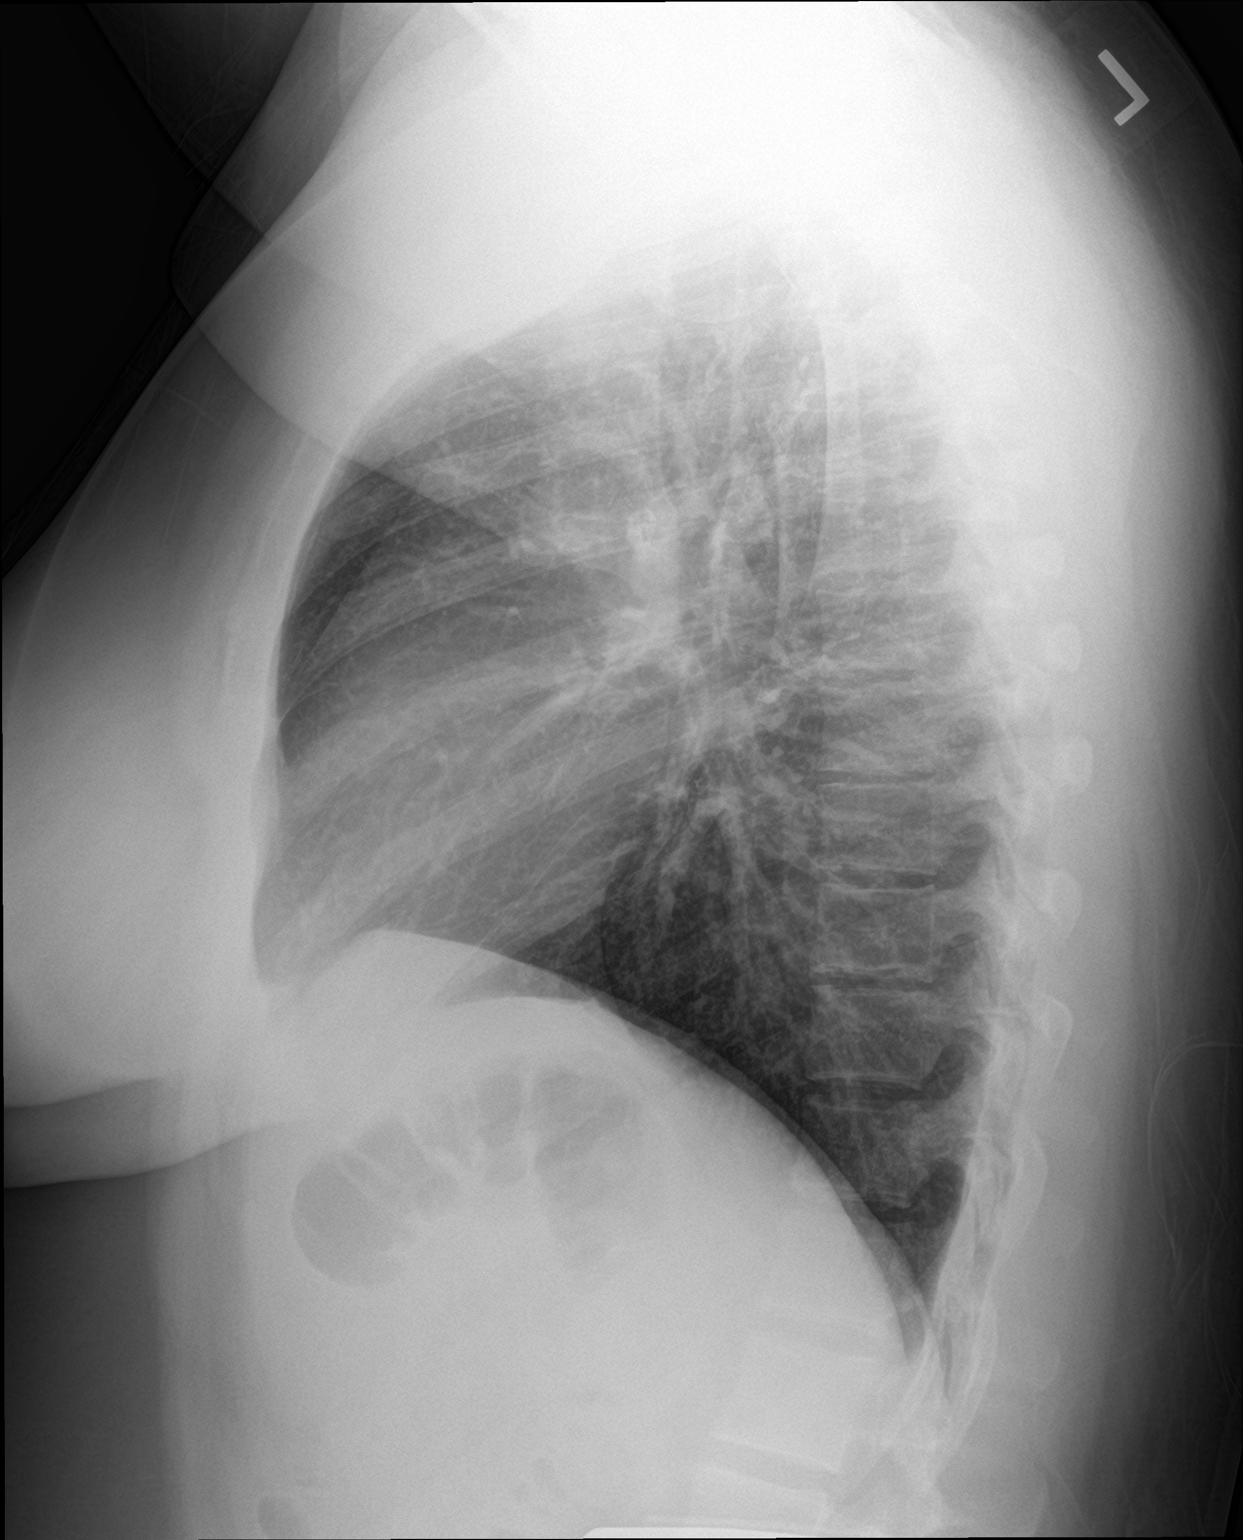

[2 of 2 positions shown; findings below may reference images not displayed]

FINDINGS: Cardiomediastinal silhouette is within normal limits in size and
configuration. Lungs are clear. Lung volumes are normal. No evidence
of pneumonia. No pleural effusion. No pneumothorax seen.

Osseous and soft tissue structures about the chest are unremarkable.
IMPRESSION: No active cardiopulmonary disease. No evidence of pneumonia or
pulmonary edema.

## 2022-04-17 ENCOUNTER — Ambulatory Visit: Payer: 59 | Admitting: Family

## 2022-05-06 DIAGNOSIS — Z13220 Encounter for screening for lipoid disorders: Secondary | ICD-10-CM | POA: Diagnosis not present

## 2022-05-06 DIAGNOSIS — Z01419 Encounter for gynecological examination (general) (routine) without abnormal findings: Secondary | ICD-10-CM | POA: Diagnosis not present

## 2022-05-06 DIAGNOSIS — Z130 Encounter for screening for diseases of the blood and blood-forming organs and certain disorders involving the immune mechanism: Secondary | ICD-10-CM | POA: Diagnosis not present

## 2022-05-06 DIAGNOSIS — Z1329 Encounter for screening for other suspected endocrine disorder: Secondary | ICD-10-CM | POA: Diagnosis not present

## 2022-06-04 DIAGNOSIS — Z3046 Encounter for surveillance of implantable subdermal contraceptive: Secondary | ICD-10-CM | POA: Diagnosis not present

## 2024-03-15 DIAGNOSIS — Z3480 Encounter for supervision of other normal pregnancy, unspecified trimester: Secondary | ICD-10-CM | POA: Diagnosis not present

## 2024-03-15 DIAGNOSIS — Z3201 Encounter for pregnancy test, result positive: Secondary | ICD-10-CM | POA: Diagnosis not present

## 2024-03-15 DIAGNOSIS — Z113 Encounter for screening for infections with a predominantly sexual mode of transmission: Secondary | ICD-10-CM | POA: Diagnosis not present

## 2024-03-15 DIAGNOSIS — N926 Irregular menstruation, unspecified: Secondary | ICD-10-CM | POA: Diagnosis not present

## 2024-03-15 DIAGNOSIS — R110 Nausea: Secondary | ICD-10-CM | POA: Diagnosis not present

## 2024-04-13 DIAGNOSIS — Z3480 Encounter for supervision of other normal pregnancy, unspecified trimester: Secondary | ICD-10-CM | POA: Diagnosis not present

## 2024-04-13 DIAGNOSIS — Z3481 Encounter for supervision of other normal pregnancy, first trimester: Secondary | ICD-10-CM | POA: Diagnosis not present

## 2024-04-13 DIAGNOSIS — Z369 Encounter for antenatal screening, unspecified: Secondary | ICD-10-CM | POA: Diagnosis not present

## 2024-04-13 LAB — OB RESULTS CONSOLE HEPATITIS B SURFACE ANTIGEN: Hepatitis B Surface Ag: NEGATIVE

## 2024-04-13 LAB — OB RESULTS CONSOLE RUBELLA ANTIBODY, IGM: Rubella: NON-IMMUNE/NOT IMMUNE

## 2024-04-13 LAB — OB RESULTS CONSOLE HIV ANTIBODY (ROUTINE TESTING): HIV: NONREACTIVE

## 2024-04-13 LAB — OB RESULTS CONSOLE RPR: RPR: NONREACTIVE

## 2024-05-11 DIAGNOSIS — Z369 Encounter for antenatal screening, unspecified: Secondary | ICD-10-CM | POA: Diagnosis not present

## 2024-06-07 DIAGNOSIS — Z3689 Encounter for other specified antenatal screening: Secondary | ICD-10-CM | POA: Diagnosis not present

## 2024-06-07 DIAGNOSIS — Z3A18 18 weeks gestation of pregnancy: Secondary | ICD-10-CM | POA: Diagnosis not present

## 2024-06-07 DIAGNOSIS — Z363 Encounter for antenatal screening for malformations: Secondary | ICD-10-CM | POA: Diagnosis not present

## 2024-07-06 DIAGNOSIS — Z369 Encounter for antenatal screening, unspecified: Secondary | ICD-10-CM | POA: Diagnosis not present

## 2024-08-03 DIAGNOSIS — Z369 Encounter for antenatal screening, unspecified: Secondary | ICD-10-CM | POA: Diagnosis not present

## 2024-08-03 DIAGNOSIS — Z3480 Encounter for supervision of other normal pregnancy, unspecified trimester: Secondary | ICD-10-CM | POA: Diagnosis not present

## 2024-08-04 ENCOUNTER — Inpatient Hospital Stay (HOSPITAL_COMMUNITY): Admission: AD | Admit: 2024-08-04 | Source: Home / Self Care | Admitting: Obstetrics

## 2024-08-16 DIAGNOSIS — Z369 Encounter for antenatal screening, unspecified: Secondary | ICD-10-CM | POA: Diagnosis not present

## 2024-08-27 ENCOUNTER — Inpatient Hospital Stay (HOSPITAL_COMMUNITY)
Admission: AD | Admit: 2024-08-27 | Discharge: 2024-08-27 | Disposition: A | Discharge status: Home / Self Care | Attending: Obstetrics | Admitting: Obstetrics

## 2024-08-27 ENCOUNTER — Other Ambulatory Visit: Payer: Self-pay

## 2024-08-27 ENCOUNTER — Encounter (HOSPITAL_COMMUNITY): Payer: Self-pay | Admitting: *Deleted

## 2024-08-27 DIAGNOSIS — N858 Other specified noninflammatory disorders of uterus: Secondary | ICD-10-CM | POA: Diagnosis not present

## 2024-08-27 DIAGNOSIS — O26893 Other specified pregnancy related conditions, third trimester: Secondary | ICD-10-CM

## 2024-08-27 DIAGNOSIS — Z3689 Encounter for other specified antenatal screening: Secondary | ICD-10-CM

## 2024-08-27 DIAGNOSIS — Z3A29 29 weeks gestation of pregnancy: Secondary | ICD-10-CM

## 2024-08-27 DIAGNOSIS — Z87891 Personal history of nicotine dependence: Secondary | ICD-10-CM | POA: Insufficient documentation

## 2024-08-27 DIAGNOSIS — O42913 Preterm premature rupture of membranes, unspecified as to length of time between rupture and onset of labor, third trimester: Secondary | ICD-10-CM | POA: Insufficient documentation

## 2024-08-27 DIAGNOSIS — Z3493 Encounter for supervision of normal pregnancy, unspecified, third trimester: Secondary | ICD-10-CM

## 2024-08-27 DIAGNOSIS — R109 Unspecified abdominal pain: Secondary | ICD-10-CM | POA: Insufficient documentation

## 2024-08-27 LAB — URINALYSIS, ROUTINE W REFLEX MICROSCOPIC
Bilirubin Urine: NEGATIVE
Glucose, UA: NEGATIVE mg/dL
Hgb urine dipstick: NEGATIVE
Ketones, ur: NEGATIVE mg/dL
Leukocytes,Ua: NEGATIVE
Nitrite: NEGATIVE
Protein, ur: NEGATIVE mg/dL
Specific Gravity, Urine: 1.013 (ref 1.005–1.030)
pH: 7 (ref 5.0–8.0)

## 2024-08-27 LAB — RUPTURE OF MEMBRANE (ROM)PLUS: Rom Plus: NEGATIVE

## 2024-08-27 LAB — WET PREP, GENITAL
Clue Cells Wet Prep HPF POC: NONE SEEN
Sperm: NONE SEEN
Trich, Wet Prep: NONE SEEN
WBC, Wet Prep HPF POC: <10 (ref <10)
Yeast Wet Prep HPF POC: NONE SEEN

## 2024-08-27 LAB — POCT FERN TEST: POCT Fern Test: NEGATIVE

## 2024-08-27 NOTE — MAU Note (Signed)
 Jody Reed is a 23 y.o. at [redacted]w[redacted]d here in MAU reporting: around 3, started feeling wetness, little gushing.  Went through underwear and pants.  Still coming. Clear some whitish watery.  Mild cramping on lower right side. No bleeding.  Reports +FM  Onset of complaint: 1500 Pain score: mild Vitals:   08/27/24 1730  BP: 135/77  Pulse: 87  Resp: 17  Temp: 98.5 F (36.9 C)  SpO2: 100%     FHT:155 Lab orders placed from triage:  fern

## 2024-08-27 NOTE — Discharge Instructions (Signed)
 We are so glad you are feeling better! With your negative tests, is it not likely that you are experiencing preterm labor, and this is most likely uterine irritability.   Come back to the ED for: You have a sudden gush or slow leaking of fluid from the vagina after 37 weeks of pregnancy. You have constant wet underwear after 37 weeks of pregnancy. Your water breaks before you are [redacted] weeks pregnant. You have signs of an infection, such as: Fever. Chills. Body aches. Belly pain. You have signs of labor, such as: Belly pain. Cramping that feels like menstrual cramps. You have bleeding from the vagina. The color of your vaginal fluid changes from clear to green, brown, or bloody. You see the umbilical cord sticking out from your vagina or you feel the cord in your vagina. You feel the need to be seen in the ED

## 2024-08-27 NOTE — MAU Provider Note (Addendum)
 Chief Complaint:  Rupture of Membranes and Abdominal Pain   HPI  Jody Reed is a 23 y.o. G1P0 at [redacted]w[redacted]d who presents to maternity admissions reporting a gush of fluid around 1430 today. Patient states she was at work when she noted a gush of fluid and her underwear became saturated. She went home a few hours later and cleaned up but can still feel she is leaking fluid. Approximately 2 hours prior to arrival, she began to feel intermittent episodes of abdominal discomfort and tightness lasting around 1-2 minutes at a time, which she does not describe as pain. They came on more randomly than regular, every 15-20-ish minutes. She denies vaginal bleeding, N/V/D, fevers, CP, SOB, palpitations, or any other complaints at this time.   PNC received at [facility]    Past Medical History:  Diagnosis Date   BMI (body mass index), pediatric, 95-99% for age    83 History  63 Para Term Preterm AB Living  1       SAB IAB Ectopic Multiple Live Births          # Outcome Date GA Lbr Len/2nd Weight Sex Type Anes PTL Lv  1 Current            Past Surgical History:  Procedure Laterality Date   NO PAST SURGERIES     Family History  Problem Relation Age of Onset   Vision loss Father        macular degeneration in one eye   Diabetes Maternal Grandmother    Heart disease Maternal Grandmother    Hyperlipidemia Maternal Grandmother    Hypertension Maternal Grandmother    Miscarriages / Stillbirths Maternal Grandmother    Stroke Maternal Grandfather    Diabetes Paternal Grandfather    Heart disease Paternal Grandfather    Hyperlipidemia Paternal Grandfather    Hypertension Paternal Grandfather    Asthma Paternal Uncle    Alcohol abuse Neg Hx    Arthritis Neg Hx    Birth defects Neg Hx    Cancer Neg Hx    COPD Neg Hx    Depression Neg Hx    Drug abuse Neg Hx    Early death Neg Hx    Hearing loss Neg Hx    Kidney disease Neg Hx    Learning disabilities Neg Hx    Mental  illness Neg Hx    Mental retardation Neg Hx    Varicose Veins Neg Hx    Social History   Tobacco Use   Smoking status: Never   Smokeless tobacco: Never  Vaping Use   Vaping status: Former  Substance Use Topics   Alcohol use: Not Currently   Drug use: No   No Known Allergies No medications prior to admission.   I have reviewed patient's Past Medical Hx, Surgical Hx, Family Hx, Social Hx, medications and allergies.   ROS  Pertinent items noted in HPI and remainder of comprehensive ROS otherwise negative.   PHYSICAL EXAM  Patient Vitals for the past 24 hrs:  BP Temp Temp src Pulse Resp SpO2 Height Weight  08/27/24 2002 (!) 140/67 -- -- 82 -- -- -- --  08/27/24 1839 -- -- -- -- -- 99 % -- --  08/27/24 1753 135/75 -- -- 87 16 99 % -- --  08/27/24 1730 135/77 98.5 F (36.9 C) Oral 87 17 100 % 5' 6 (1.676 m) 97.5 kg   Constitutional: Well-developed, well-nourished female in no acute distress Cardiovascular: normal rate & rhythm,  warm and well-perfused, 2+ radial pulses  Respiratory: normal effort, no problems with respiration noted, good air movement throughout all lung fields GI: Abd soft, non-tender, gravid uterus MS: Extremities nontender, no edema, normal ROM Neurologic: Alert and oriented x 4, CN II-XII grossly intact.  Pelvic: normal external female genitalia, mild white discharge, no blood, cervix clean, no pooling of fluid. Chaperones present: Olam Dalton NP and Jeoffrey Ade, RN  Fetal Tracing: NST Reactive Baseline: 150 HR Variability: present Accelerations: present Decelerations: none Toco: 20-30 mmHg   Labs: Results for orders placed or performed during the hospital encounter of 08/27/24 (from the past 24 hours)  POCT fern test     Status: None   Collection Time: 08/27/24  6:06 PM  Result Value Ref Range   POCT Fern Test Negative = intact amniotic membranes   Wet prep, genital     Status: None   Collection Time: 08/27/24  6:42 PM  Result Value Ref Range    Yeast Wet Prep HPF POC NONE SEEN NONE SEEN   Trich, Wet Prep NONE SEEN NONE SEEN   Clue Cells Wet Prep HPF POC NONE SEEN NONE SEEN   WBC, Wet Prep HPF POC <10 <10   Sperm NONE SEEN   Urinalysis, Routine w reflex microscopic -Urine, Clean Catch     Status: Abnormal   Collection Time: 08/27/24  6:42 PM  Result Value Ref Range   Color, Urine YELLOW YELLOW   APPearance CLOUDY (A) CLEAR   Specific Gravity, Urine 1.013 1.005 - 1.030   pH 7.0 5.0 - 8.0   Glucose, UA NEGATIVE NEGATIVE mg/dL   Hgb urine dipstick NEGATIVE NEGATIVE   Bilirubin Urine NEGATIVE NEGATIVE   Ketones, ur NEGATIVE NEGATIVE mg/dL   Protein, ur NEGATIVE NEGATIVE mg/dL   Nitrite NEGATIVE NEGATIVE   Leukocytes,Ua NEGATIVE NEGATIVE  Rupture of Membrane (ROM) Plus     Status: None   Collection Time: 08/27/24  6:42 PM  Result Value Ref Range   Rom Plus NEGATIVE     MDM & MAU COURSE  MDM:  HIGH  MAU Course: Orders Placed This Encounter  Procedures   Wet prep, genital   Urinalysis, Routine w reflex microscopic -Urine, Clean Catch   Rupture of Membrane (ROM) Plus   POCT fern test   Discharge patient   ASSESSMENT   1. Intact amniotic membranes during pregnancy in third trimester   2. Uterine irritability   3. [redacted] weeks gestation of pregnancy   4. NST (non-stress test) reactive    23yo F G1P0 at [redacted]w[redacted]d presenting after noting a gush of clear fluid around 1430. Fern and ROM+ negative. No pooling present on speculum exam. NST reassuring with mild uterine irritability. No evidence of  PPROM with negative Fern/ROM Plus.. Advised to rest and stay well hydrated to prevent uterine irritability. Patient verbalizes understanding and is agreeable with plan.  PLAN  Discharge home in stable condition with return precautions.  Follow up with your OBGYN as scheduled.     Allergies as of 08/27/2024   No Known Allergies      Medication List     TAKE these medications    PRENATAL 1 PO Take by mouth.       Camie Dixons, DO PGY-1 Family Medicine Resident [facility] Family Medicine Residency  Attestation of Supervision of Student:  I confirm that I have verified the information documented in the Resident Physician's  student's note and that I have also personally reperformed the history, physical exam and  all medical decision making activities.  I have verified that all services and findings are accurately documented in this student's note; and I agree with management and plan as outlined in the documentation. I have also made any necessary editorial changes.  I was personally present and verified the HPI, re performed the Physical exam, interpreted the lab results and NST.  Patient with one mild range BP at 140/67 otherwise all normotensive and patient asymptomatic for s/s of preeclampsia . Prenatal records with all normal BP's   ( Message and note sent to the primary office for BP check  08/30/24 ) I agree with the A/P as written above.  Olam Dalton, MSN, [facility], Center for [facility]   Olam DELENA Dalton, NP Center for [employer], [facility] 08/28/2024 8:00 AM

## 2024-08-30 LAB — GC/CHLAMYDIA PROBE AMP (~~LOC~~) NOT AT ARMC
Chlamydia: NEGATIVE
Comment: NEGATIVE
Comment: NORMAL
Neisseria Gonorrhea: NEGATIVE

## 2024-09-01 DIAGNOSIS — Z23 Encounter for immunization: Secondary | ICD-10-CM | POA: Diagnosis not present

## 2024-09-13 DIAGNOSIS — O2603 Excessive weight gain in pregnancy, third trimester: Secondary | ICD-10-CM | POA: Diagnosis not present

## 2024-09-13 DIAGNOSIS — Z3A32 32 weeks gestation of pregnancy: Secondary | ICD-10-CM | POA: Diagnosis not present

## 2024-09-29 DIAGNOSIS — Z369 Encounter for antenatal screening, unspecified: Secondary | ICD-10-CM | POA: Diagnosis not present

## 2024-10-15 ENCOUNTER — Inpatient Hospital Stay (HOSPITAL_COMMUNITY)
Admission: AD | Admit: 2024-10-15 | Discharge: 2024-10-15 | Disposition: A | Discharge status: Home / Self Care | Attending: Obstetrics | Admitting: Obstetrics

## 2024-10-15 ENCOUNTER — Other Ambulatory Visit: Payer: Self-pay

## 2024-10-15 DIAGNOSIS — O26893 Other specified pregnancy related conditions, third trimester: Secondary | ICD-10-CM | POA: Insufficient documentation

## 2024-10-15 DIAGNOSIS — Z0371 Encounter for suspected problem with amniotic cavity and membrane ruled out: Secondary | ICD-10-CM

## 2024-10-15 DIAGNOSIS — Z3A36 36 weeks gestation of pregnancy: Secondary | ICD-10-CM | POA: Diagnosis not present

## 2024-10-15 DIAGNOSIS — R030 Elevated blood-pressure reading, without diagnosis of hypertension: Secondary | ICD-10-CM | POA: Insufficient documentation

## 2024-10-15 DIAGNOSIS — O1203 Gestational edema, third trimester: Secondary | ICD-10-CM

## 2024-10-15 DIAGNOSIS — N898 Other specified noninflammatory disorders of vagina: Secondary | ICD-10-CM | POA: Diagnosis not present

## 2024-10-15 DIAGNOSIS — R609 Edema, unspecified: Secondary | ICD-10-CM | POA: Diagnosis not present

## 2024-10-15 LAB — COMPREHENSIVE METABOLIC PANEL WITH GFR
ALT: 16 U/L (ref 0–44)
AST: 20 U/L (ref 15–41)
Albumin: 3.1 g/dL — ABNORMAL LOW (ref 3.5–5.0)
Alkaline Phosphatase: 143 U/L — ABNORMAL HIGH (ref 38–126)
Anion gap: 11 (ref 5–15)
BUN: 6 mg/dL (ref 6–20)
CO2: 21 mmol/L — ABNORMAL LOW (ref 22–32)
Calcium: 9 mg/dL (ref 8.9–10.3)
Chloride: 106 mmol/L (ref 98–111)
Creatinine, Ser: 0.7 mg/dL (ref 0.44–1.00)
GFR, Estimated: >60 mL/min
Glucose, Bld: 129 mg/dL — ABNORMAL HIGH (ref 70–99)
Potassium: 3.5 mmol/L (ref 3.5–5.1)
Sodium: 138 mmol/L (ref 135–145)
Total Bilirubin: <0.2 mg/dL (ref 0.0–1.2)
Total Protein: 5.7 g/dL — ABNORMAL LOW (ref 6.5–8.1)

## 2024-10-15 LAB — URINALYSIS, MICROSCOPIC (REFLEX)

## 2024-10-15 LAB — CBC
HCT: 32.6 % — ABNORMAL LOW (ref 36.0–46.0)
Hemoglobin: 10.9 g/dL — ABNORMAL LOW (ref 12.0–15.0)
MCH: 31.4 pg (ref 26.0–34.0)
MCHC: 33.4 g/dL (ref 30.0–36.0)
MCV: 93.9 fL (ref 80.0–100.0)
Platelets: 269 K/uL (ref 150–400)
RBC: 3.47 MIL/uL — ABNORMAL LOW (ref 3.87–5.11)
RDW: 12.5 % (ref 11.5–15.5)
WBC: 10.9 K/uL — ABNORMAL HIGH (ref 4.0–10.5)
nRBC: 0 % (ref 0.0–0.2)

## 2024-10-15 LAB — PROTEIN / CREATININE RATIO, URINE
Creatinine, Urine: 44 mg/dL
Total Protein, Urine: <6 mg/dL

## 2024-10-15 LAB — POCT FERN TEST: POCT Fern Test: NEGATIVE

## 2024-10-15 LAB — URINALYSIS, ROUTINE W REFLEX MICROSCOPIC
Bilirubin Urine: NEGATIVE
Glucose, UA: NEGATIVE mg/dL
Hgb urine dipstick: NEGATIVE
Ketones, ur: NEGATIVE mg/dL
Nitrite: NEGATIVE
Protein, ur: NEGATIVE mg/dL
Specific Gravity, Urine: <1.005 — ABNORMAL LOW (ref 1.005–1.030)
pH: 6.5 (ref 5.0–8.0)

## 2024-10-15 LAB — RUPTURE OF MEMBRANE (ROM)PLUS: Rom Plus: NEGATIVE

## 2024-10-15 NOTE — Progress Notes (Signed)
 Pt stated she will see her DC papers in mychart. Left without it.

## 2024-10-15 NOTE — MAU Note (Signed)
 Jody Reed is a 23 y.o. at [redacted]w[redacted]d here in MAU reporting: she began leaking clear fluid this afternoon at 1400, reports clear fluid.  States beginning to have mild ctxs that are 9-12 minutes apart.  Denies VB.  Reports +FM.  LMP:  Onset of complaint: today Pain score: 4 pelvic pressure Vitals:   10/15/24 1727  BP: (!) 144/75  Pulse: 93  Resp: 20  Temp: 98.7 F (37.1 C)  SpO2: 99%     FHT: 136 bpm  Lab orders placed from triage: UA

## 2024-10-15 NOTE — MAU Provider Note (Signed)
 " History     CSN: [id number]  Arrival date and time: 10/15/24 1708   Event Date/Time   First Provider Initiated Contact with Patient 10/15/24 1814      Chief Complaint  Patient presents with   Contractions   Rupture of Membranes   HPI Jody Reed is a 23 y.o. year old G1P0 female at [redacted]w[redacted]d weeks gestation who presents to MAU reporting leaking clear fluid since 1400 today. She endorses mild contractions every 9-12 minutes since that time as well. She denies VB. She endorses vigorous FM today. She receives [facility] with [facility]; next appt needs to be scheduled. Her spouse is present and contributing to the history taking.     OB History     Gravida  1   Para      Term      Preterm      AB      Living         SAB      IAB      Ectopic      Multiple      Live Births              Past Medical History:  Diagnosis Date   BMI (body mass index), pediatric, 95-99% for age     Past Surgical History:  Procedure Laterality Date   NO PAST SURGERIES      Family History  Problem Relation Age of Onset   Vision loss Father        macular degeneration in one eye   Diabetes Maternal Grandmother    Heart disease Maternal Grandmother    Hyperlipidemia Maternal Grandmother    Hypertension Maternal Grandmother    Miscarriages / Stillbirths Maternal Grandmother    Stroke Maternal Grandfather    Diabetes Paternal Grandfather    Heart disease Paternal Grandfather    Hyperlipidemia Paternal Grandfather    Hypertension Paternal Grandfather    Asthma Paternal Uncle    Alcohol abuse Neg Hx    Arthritis Neg Hx    Birth defects Neg Hx    Cancer Neg Hx    COPD Neg Hx    Depression Neg Hx    Drug abuse Neg Hx    Early death Neg Hx    Hearing loss Neg Hx    Kidney disease Neg Hx    Learning disabilities Neg Hx    Mental illness Neg Hx    Mental retardation Neg Hx    Varicose Veins Neg Hx     Social History[1]  Allergies: Allergies[2]  No  medications prior to admission.    Review of Systems  Constitutional: Negative.   HENT: Negative.    Eyes: Negative.   Respiratory: Negative.    Cardiovascular:  Positive for leg swelling.  Endocrine: Negative.   Genitourinary:  Positive for vaginal discharge (leaking clera fluid since 1400 today; underwear and pants were wet).  Musculoskeletal: Negative.   Skin: Negative.   Allergic/Immunologic: Negative.   Neurological: Negative.   Hematological: Negative.   Psychiatric/Behavioral: Negative.     Physical Exam   Patient Vitals for the past 24 hrs:  BP Temp Temp src Pulse Resp SpO2 Height Weight  10/15/24 1945 127/73 -- -- 99 -- 99 % -- --  10/15/24 1930 120/67 -- -- 91 -- 99 % -- --  10/15/24 1915 (!) 115/48 -- -- 95 -- 99 % -- --  10/15/24 1900 121/80 -- -- (!) 112 -- 99 % -- --  10/15/24 1845 131/67 -- -- 93 -- 99 % -- --  10/15/24 1830 (!) 140/81 -- -- (!) 101 -- 97 % -- --  10/15/24 1815 137/75 -- -- (!) 114 -- 98 % -- --  10/15/24 1802 138/80 -- -- (!) 108 -- -- -- --  10/15/24 1750 -- -- -- -- -- 98 % -- --  10/15/24 1748 130/70 -- -- -- -- -- -- --  10/15/24 1727 (!) 144/75 98.7 F (37.1 C) Axillary 93 20 99 % -- --  10/15/24 1724 -- -- -- -- -- -- 5' 6 (1.676 m) 107.3 kg     Physical Exam Vitals and nursing note reviewed. Exam conducted with a chaperone present.  Constitutional:      Appearance: Normal appearance. She is obese.  Genitourinary:    Vagina: Vaginal discharge present.     Comments: Pelvic exam: External genitalia normal, SE: vaginal walls pink and well rugated, cervix is smooth, pink, no lesions, small amt of thin, white vaginal d/c -- ROM(+) done, cervix visually closed. Cervical exam deferred. Musculoskeletal:        General: Swelling present. Normal range of motion.     Right lower leg: Edema present.     Left lower leg: Edema present.  Skin:    General: Skin is warm and dry.  Neurological:     Mental Status: She is alert and oriented to  person, place, and time.    REACTIVE NST - FHR: 140 bpm / moderate variability / accels present / decels absent / TOCO: none  MAU Course  Procedures  MDM CCUA CBC CMP P/C Ratio Serial BP's   Results for orders placed or performed during the hospital encounter of 10/15/24 (from the past 24 hours)  Fern Test     Status: Normal   Collection Time: 10/15/24  5:45 PM  Result Value Ref Range   POCT Fern Test Negative = intact amniotic membranes   Urinalysis, Routine w reflex microscopic -Urine, Clean Catch     Status: Abnormal   Collection Time: 10/15/24  6:09 PM  Result Value Ref Range   Color, Urine YELLOW YELLOW   APPearance CLEAR CLEAR   Specific Gravity, Urine <1.005 (L) 1.005 - 1.030   pH 6.5 5.0 - 8.0   Glucose, UA NEGATIVE NEGATIVE mg/dL   Hgb urine dipstick NEGATIVE NEGATIVE   Bilirubin Urine NEGATIVE NEGATIVE   Ketones, ur NEGATIVE NEGATIVE mg/dL   Protein, ur NEGATIVE NEGATIVE mg/dL   Nitrite NEGATIVE NEGATIVE   Leukocytes,Ua TRACE (A) NEGATIVE  Rupture of Membrane (ROM) Plus     Status: None   Collection Time: 10/15/24  6:09 PM  Result Value Ref Range   Rom Plus NEGATIVE   Protein / creatinine ratio, urine     Status: None   Collection Time: 10/15/24  6:09 PM  Result Value Ref Range   Creatinine, Urine 44 mg/dL   Total Protein, Urine <6 mg/dL   Protein Creatinine Ratio NOT CALCULATED <0.2 mg/mg  Urinalysis, Microscopic (reflex)     Status: Abnormal   Collection Time: 10/15/24  6:09 PM  Result Value Ref Range   RBC / HPF 0-5 0 - 5 RBC/hpf   WBC, UA 0-5 0 - 5 WBC/hpf   Bacteria, UA RARE (A) NONE SEEN   Squamous Epithelial / HPF 0-5 0 - 5 /HPF      Assessment and Plan  1. No leakage of amniotic fluid into vagina (Primary) - Reassurance given that vaginal d/c could be normal variation  of pregnancy  2. Elevated blood pressure reading without diagnosis of hypertension - PEC WNL - BPs stabilized  3. Edema during pregnancy in third trimester - Encouraged to  drink plenty of water and elevate feet  4. [redacted] weeks gestation of pregnancy   - Discharge home - Keep scheduled appt with [facility]  - Patient verbalized an understanding of the plan of care and agrees.   Ala Cart, CNM 10/15/2024, 6:14 PM     [1]  Social History Tobacco Use   Smoking status: Never   Smokeless tobacco: Never  Vaping Use   Vaping status: Former  Substance Use Topics   Alcohol use: Not Currently   Drug use: No  [2] No Known Allergies  "

## 2024-10-22 DIAGNOSIS — Z3480 Encounter for supervision of other normal pregnancy, unspecified trimester: Secondary | ICD-10-CM | POA: Diagnosis not present

## 2024-10-22 DIAGNOSIS — Z113 Encounter for screening for infections with a predominantly sexual mode of transmission: Secondary | ICD-10-CM | POA: Diagnosis not present

## 2024-10-22 DIAGNOSIS — Z369 Encounter for antenatal screening, unspecified: Secondary | ICD-10-CM | POA: Diagnosis not present

## 2024-10-25 DIAGNOSIS — Z369 Encounter for antenatal screening, unspecified: Secondary | ICD-10-CM | POA: Diagnosis not present

## 2024-10-25 LAB — OB RESULTS CONSOLE GBS: GBS: POSITIVE

## 2024-10-28 NOTE — L&D Delivery Note (Signed)
 Delivery Note Pt labored well to complete with urge to push. She pushed for 2-66mins and at 5:35 PM a viable female was delivered via Vaginal, Spontaneous (Presentation:   left Occiput Anterior).  APGAR: 8,9 ; weight pending Anterior and posterior shoulders delivered easily with next two pushes. Body cord noted. Body easily followed.   Cord clamped and cut at mark per pt request. Cord blood obtained.  Placenta delivered about later intact schulz. .   Placenta status: Spontaneous, Intact.  Cord: 3 vessels with the following complications: None.  Cord pH: n/a  Anesthesia: Epidural Episiotomy: None Lacerations: 2nd degree;Perineal Suture Repair: 2.0 vicryl Est. Blood Loss (mL):  pending count by tech   Mom to postpartum.  Baby to Couplet care / Skin to Skin. On the fence about whether will get circumcision done in hospital or outpt  Ted LELON Solo 11/09/2024, 6:11 PM

## 2024-11-09 ENCOUNTER — Inpatient Hospital Stay (HOSPITAL_COMMUNITY): Admitting: Anesthesiology

## 2024-11-09 ENCOUNTER — Other Ambulatory Visit: Payer: Self-pay

## 2024-11-09 ENCOUNTER — Encounter (HOSPITAL_COMMUNITY): Payer: Self-pay | Admitting: Obstetrics and Gynecology

## 2024-11-09 ENCOUNTER — Inpatient Hospital Stay (HOSPITAL_COMMUNITY)
Admission: RE | Admit: 2024-11-09 | Discharge: 2024-11-11 | DRG: 807 | Disposition: A | Discharge status: Home / Self Care | Attending: Obstetrics and Gynecology | Admitting: Obstetrics and Gynecology

## 2024-11-09 DIAGNOSIS — Z833 Family history of diabetes mellitus: Secondary | ICD-10-CM | POA: Diagnosis not present

## 2024-11-09 DIAGNOSIS — Z3493 Encounter for supervision of normal pregnancy, unspecified, third trimester: Principal | ICD-10-CM

## 2024-11-09 DIAGNOSIS — O99824 Streptococcus B carrier state complicating childbirth: Secondary | ICD-10-CM | POA: Diagnosis present

## 2024-11-09 DIAGNOSIS — Z8249 Family history of ischemic heart disease and other diseases of the circulatory system: Secondary | ICD-10-CM | POA: Diagnosis not present

## 2024-11-09 DIAGNOSIS — O134 Gestational [pregnancy-induced] hypertension without significant proteinuria, complicating childbirth: Principal | ICD-10-CM | POA: Diagnosis present

## 2024-11-09 DIAGNOSIS — O26893 Other specified pregnancy related conditions, third trimester: Secondary | ICD-10-CM | POA: Diagnosis present

## 2024-11-09 LAB — COMPREHENSIVE METABOLIC PANEL WITH GFR
ALT: 13 U/L (ref 0–44)
AST: 21 U/L (ref 15–41)
Albumin: 3.3 g/dL — ABNORMAL LOW (ref 3.5–5.0)
Alkaline Phosphatase: 188 U/L — ABNORMAL HIGH (ref 38–126)
Anion gap: 12 (ref 5–15)
BUN: 8 mg/dL (ref 6–20)
CO2: 19 mmol/L — ABNORMAL LOW (ref 22–32)
Calcium: 9 mg/dL (ref 8.9–10.3)
Chloride: 106 mmol/L (ref 98–111)
Creatinine, Ser: 0.73 mg/dL (ref 0.44–1.00)
GFR, Estimated: >60 mL/min
Glucose, Bld: 88 mg/dL (ref 70–99)
Potassium: 3.9 mmol/L (ref 3.5–5.1)
Sodium: 137 mmol/L (ref 135–145)
Total Bilirubin: 0.2 mg/dL (ref 0.0–1.2)
Total Protein: 6.2 g/dL — ABNORMAL LOW (ref 6.5–8.1)

## 2024-11-09 LAB — CBC
HCT: 34.1 % — ABNORMAL LOW (ref 36.0–46.0)
Hemoglobin: 11.6 g/dL — ABNORMAL LOW (ref 12.0–15.0)
MCH: 31.1 pg (ref 26.0–34.0)
MCHC: 34 g/dL (ref 30.0–36.0)
MCV: 91.4 fL (ref 80.0–100.0)
Platelets: 268 K/uL (ref 150–400)
RBC: 3.73 MIL/uL — ABNORMAL LOW (ref 3.87–5.11)
RDW: 12.8 % (ref 11.5–15.5)
WBC: 10.1 K/uL (ref 4.0–10.5)
nRBC: 0 % (ref 0.0–0.2)

## 2024-11-09 LAB — TYPE AND SCREEN
ABO/RH(D): A POS
Antibody Screen: NEGATIVE

## 2024-11-09 LAB — PROTEIN / CREATININE RATIO, URINE
Creatinine, Urine: 117 mg/dL
Protein Creatinine Ratio: 0.1 mg/mg
Total Protein, Urine: 12 mg/dL

## 2024-11-09 LAB — SYPHILIS: RPR W/REFLEX TO RPR TITER AND TREPONEMAL ANTIBODIES, TRADITIONAL SCREENING AND DIAGNOSIS ALGORITHM: RPR Ser Ql: NONREACTIVE

## 2024-11-09 MED ORDER — INFLUENZA VIRUS VACC SPLIT PF (FLUZONE) 0.5 ML IM SUSY: 0.5000 mL | PREFILLED_SYRINGE | INTRAMUSCULAR | Status: DC

## 2024-11-09 MED ORDER — OXYCODONE-ACETAMINOPHEN 5-325 MG PO TABS: 2.0000 | ORAL_TABLET | ORAL | Status: DC | PRN

## 2024-11-09 MED ORDER — ONDANSETRON HCL 4 MG PO TABS: 4.0000 mg | ORAL_TABLET | ORAL | Status: DC | PRN

## 2024-11-09 MED ORDER — TETANUS-DIPHTH-ACELL PERTUSSIS 5-2-15.5 LF-MCG/0.5 IM SUSP: 0.5000 mL | Freq: Once | INTRAMUSCULAR | Status: DC

## 2024-11-09 MED ORDER — SODIUM CHLORIDE 0.9 % IV SOLN
5.0000 10*6.[IU] | Freq: Once | INTRAVENOUS | Status: AC
  Administered 2024-11-09: 5 10*6.[IU] via INTRAVENOUS
  Filled 2024-11-09: qty 5

## 2024-11-09 MED ORDER — EPHEDRINE 5 MG/ML INJ: 10.0000 mg | INTRAVENOUS | Status: DC | PRN

## 2024-11-09 MED ORDER — DIPHENHYDRAMINE HCL 50 MG/ML IJ SOLN: 12.5000 mg | INTRAMUSCULAR | Status: DC | PRN

## 2024-11-09 MED ORDER — COCONUT OIL OIL: 1.0000 | TOPICAL_OIL | Status: DC | PRN

## 2024-11-09 MED ORDER — OXYTOCIN-SODIUM CHLORIDE 30-0.9 UT/500ML-% IV SOLN: 2.5000 [IU]/h | INTRAVENOUS | Status: DC

## 2024-11-09 MED ORDER — PHENYLEPHRINE 80 MCG/ML (10ML) SYRINGE FOR IV PUSH (FOR BLOOD PRESSURE SUPPORT): 80.0000 ug | PREFILLED_SYRINGE | INTRAVENOUS | Status: DC | PRN

## 2024-11-09 MED ORDER — IBUPROFEN 600 MG PO TABS
600.0000 mg | ORAL_TABLET | Freq: Four times a day (QID) | ORAL | Status: DC
  Administered 2024-11-10 – 2024-11-11 (×7): 600 mg via ORAL
  Filled 2024-11-09 (×7): qty 1

## 2024-11-09 MED ORDER — DIBUCAINE (PERIANAL) 1 % EX OINT: 1.0000 | TOPICAL_OINTMENT | CUTANEOUS | Status: DC | PRN

## 2024-11-09 MED ORDER — ZOLPIDEM TARTRATE 5 MG PO TABS: 5.0000 mg | ORAL_TABLET | Freq: Every evening | ORAL | Status: DC | PRN

## 2024-11-09 MED ORDER — OXYTOCIN BOLUS FROM INFUSION
333.0000 mL | Freq: Once | INTRAVENOUS | Status: AC
  Administered 2024-11-09: 333 mL via INTRAVENOUS

## 2024-11-09 MED ORDER — DIPHENHYDRAMINE HCL 25 MG PO CAPS: 25.0000 mg | ORAL_CAPSULE | Freq: Four times a day (QID) | ORAL | Status: DC | PRN

## 2024-11-09 MED ORDER — OXYCODONE HCL 5 MG PO TABS: 10.0000 mg | ORAL_TABLET | ORAL | Status: DC | PRN

## 2024-11-09 MED ORDER — SENNOSIDES-DOCUSATE SODIUM 8.6-50 MG PO TABS
2.0000 | ORAL_TABLET | Freq: Every day | ORAL | Status: DC
  Administered 2024-11-10 – 2024-11-11 (×2): 2 via ORAL
  Filled 2024-11-09 (×2): qty 2

## 2024-11-09 MED ORDER — FENTANYL CITRATE (PF) 100 MCG/2ML IJ SOLN
100.0000 ug | INTRAMUSCULAR | Status: DC | PRN
  Administered 2024-11-09: 100 ug via INTRAVENOUS

## 2024-11-09 MED ORDER — MISOPROSTOL 25 MCG QUARTER TABLET
25.0000 ug | ORAL_TABLET | ORAL | Status: DC | PRN
  Administered 2024-11-09: 25 ug via VAGINAL
  Filled 2024-11-09: qty 1

## 2024-11-09 MED ORDER — PENICILLIN G POT IN DEXTROSE 60000 UNIT/ML IV SOLN
3.0000 10*6.[IU] | INTRAVENOUS | Status: DC
  Administered 2024-11-09 (×3): 3 10*6.[IU] via INTRAVENOUS
  Filled 2024-11-09 (×3): qty 50

## 2024-11-09 MED ORDER — PRENATAL MULTIVITAMIN CH
1.0000 | ORAL_TABLET | Freq: Every day | ORAL | Status: DC
  Administered 2024-11-10 – 2024-11-11 (×2): 1 via ORAL
  Filled 2024-11-09 (×2): qty 1

## 2024-11-09 MED ORDER — ACETAMINOPHEN 325 MG PO TABS: 650.0000 mg | ORAL_TABLET | ORAL | Status: DC | PRN

## 2024-11-09 MED ORDER — ONDANSETRON HCL 4 MG/2ML IJ SOLN: 4.0000 mg | Freq: Four times a day (QID) | INTRAMUSCULAR | Status: DC | PRN

## 2024-11-09 MED ORDER — LIDOCAINE HCL (PF) 1 % IJ SOLN
INTRAMUSCULAR | Status: DC | PRN
  Administered 2024-11-09: 11 mL via EPIDURAL

## 2024-11-09 MED ORDER — SOD CITRATE-CITRIC ACID 500-334 MG/5ML PO SOLN: 30.0000 mL | ORAL | Status: DC | PRN

## 2024-11-09 MED ORDER — FENTANYL CITRATE (PF) 100 MCG/2ML IJ SOLN
INTRAMUSCULAR | Status: AC
  Filled 2024-11-09: qty 2

## 2024-11-09 MED ORDER — MISOPROSTOL 25 MCG QUARTER TABLET: 25.0000 ug | ORAL_TABLET | ORAL | Status: DC

## 2024-11-09 MED ORDER — LACTATED RINGERS AMNIOINFUSION: INTRAVENOUS | Status: DC

## 2024-11-09 MED ORDER — LACTATED RINGERS IV SOLN: INTRAVENOUS | Status: AC

## 2024-11-09 MED ORDER — LACTATED RINGERS IV SOLN
500.0000 mL | Freq: Once | INTRAVENOUS | Status: AC
  Administered 2024-11-09: 500 mL via INTRAVENOUS

## 2024-11-09 MED ORDER — OXYTOCIN-SODIUM CHLORIDE 30-0.9 UT/500ML-% IV SOLN: 2.5000 [IU]/h | INTRAVENOUS | Status: DC | PRN

## 2024-11-09 MED ORDER — TERBUTALINE SULFATE 1 MG/ML IJ SOLN: 0.2500 mg | Freq: Once | INTRAMUSCULAR | Status: DC | PRN

## 2024-11-09 MED ORDER — FENTANYL-BUPIVACAINE-NACL 0.5-0.125-0.9 MG/250ML-% EP SOLN
12.0000 mL/h | EPIDURAL | Status: DC | PRN
  Administered 2024-11-09: 12 mL/h via EPIDURAL
  Filled 2024-11-09: qty 250

## 2024-11-09 MED ORDER — OXYCODONE HCL 5 MG PO TABS: 5.0000 mg | ORAL_TABLET | ORAL | Status: DC | PRN

## 2024-11-09 MED ORDER — LACTATED RINGERS IV SOLN: INTRAVENOUS | Status: DC

## 2024-11-09 MED ORDER — BENZOCAINE-MENTHOL 20-0.5 % EX AERO
1.0000 | INHALATION_SPRAY | CUTANEOUS | Status: DC | PRN
  Administered 2024-11-09: 1 via TOPICAL
  Filled 2024-11-09: qty 56

## 2024-11-09 MED ORDER — SIMETHICONE 80 MG PO CHEW: 80.0000 mg | CHEWABLE_TABLET | ORAL | Status: DC | PRN

## 2024-11-09 MED ORDER — ONDANSETRON HCL 4 MG/2ML IJ SOLN: 4.0000 mg | INTRAMUSCULAR | Status: DC | PRN

## 2024-11-09 MED ORDER — LACTATED RINGERS IV SOLN: 500.0000 mL | INTRAVENOUS | Status: DC | PRN

## 2024-11-09 MED ORDER — LIDOCAINE HCL (PF) 1 % IJ SOLN: 30.0000 mL | INTRAMUSCULAR | Status: DC | PRN

## 2024-11-09 MED ORDER — ACETAMINOPHEN 325 MG PO TABS
650.0000 mg | ORAL_TABLET | ORAL | Status: DC | PRN
  Administered 2024-11-09: 650 mg via ORAL
  Filled 2024-11-09: qty 2

## 2024-11-09 MED ORDER — OXYCODONE-ACETAMINOPHEN 5-325 MG PO TABS: 1.0000 | ORAL_TABLET | ORAL | Status: DC | PRN

## 2024-11-09 MED ORDER — OXYTOCIN-SODIUM CHLORIDE 30-0.9 UT/500ML-% IV SOLN
1.0000 m[IU]/min | INTRAVENOUS | Status: DC
  Administered 2024-11-09: 2 m[IU]/min via INTRAVENOUS
  Filled 2024-11-09: qty 500

## 2024-11-09 MED ORDER — WITCH HAZEL-GLYCERIN EX PADS: 1.0000 | MEDICATED_PAD | CUTANEOUS | Status: DC | PRN

## 2024-11-09 NOTE — Progress Notes (Signed)
 FHR went up to 170-175 fpr 2 min with minimal to absent variability, dr delana asked to come to review strip. Then returned to 155 with moderate variability and variables

## 2024-11-09 NOTE — H&P (Signed)
 Jody Reed is a 24 y.o. female presenting for induction of labor.  Elective induction on hold fom 1/5 due to busy floor.   Pregnancy is complicated by:  1) Gestational hypertension: on review of prenatal records and MAU visits, 3 mild range BP. BP on admit is upper normal. Denies HA/CP/SOB 2) Excess weight gain in pregnancy: pre-pregnancy BMI 22 with 91 lb weight gain. 32 week growth scan AGA 3) Rubella non-immune  Normal fetal movement and pressure. Denies LOF, CTX, or vaginal bleeding. OB History     Gravida  1   Para      Term      Preterm      AB      Living         SAB      IAB      Ectopic      Multiple      Live Births             Past Medical History:  Diagnosis Date   BMI (body mass index), pediatric, 95-99% for age    Past Surgical History:  Procedure Laterality Date   NO PAST SURGERIES     Family History: family history includes Asthma in her paternal uncle; Diabetes in her maternal grandmother and paternal grandfather; Heart disease in her maternal grandmother and paternal grandfather; Hyperlipidemia in her maternal grandmother and paternal grandfather; Hypertension in her maternal grandmother and paternal grandfather; Miscarriages / Stillbirths in her maternal grandmother; Stroke in her maternal grandfather; Vision loss in her father. Social History:  reports that she has never smoked. She has never used smokeless tobacco. She reports that she does not currently use alcohol. She reports that she does not use drugs.     Maternal Diabetes: No Genetic Screening: Normal Maternal Ultrasounds/Referrals: Normal Fetal Ultrasounds or other Referrals:  None Maternal Substance Abuse:  No Significant Maternal Medications:  None Significant Maternal Lab Results:  Group B Strep positive and RNI Number of Prenatal Visits:greater than 3 verified prenatal visits Maternal Vaccinations:TDap  Review of Systems  Constitutional:  Positive for fatigue.   Eyes:  Negative for visual disturbance.  Respiratory:  Negative for shortness of breath.   Cardiovascular:  Negative for chest pain.  Gastrointestinal:  Negative for abdominal pain.  Genitourinary:  Positive for pelvic pain and vaginal discharge. Negative for vaginal bleeding.  Neurological:  Negative for headaches.   History Dilation: 4 Effacement (%): 50 Station: -2 Exam by:: LOIS Sharps, DO Posterior and moderate   Blood pressure 133/81, pulse 99, temperature 97.9 F (36.6 C), temperature source Oral, resp. rate 16, height 5' 6 (1.676 m), weight 111.9 kg, SpO2 99%. Exam Physical Exam Constitutional:      General: She is not in acute distress.    Appearance: She is obese.  HENT:     Head: Normocephalic and atraumatic.  Pulmonary:     Effort: Pulmonary effort is normal.  Musculoskeletal:        General: Normal range of motion.     Right lower leg: Edema present.     Left lower leg: Edema present.  Skin:    General: Skin is warm and dry.  Neurological:     Mental Status: She is alert.  Psychiatric:        Mood and Affect: Mood normal.        Behavior: Behavior normal.     Prenatal labs: ABO, Rh: --/--/PENDING (01/13 0248) Antibody: PENDING (01/13 0248) Rubella:  Non-immune RPR:   NR  HBsAg:   Neg HIV:   NR GBS:   Positive  Assessment/Plan: 24 yo G1P0 @ 40.1, eIOL  1) IOL: despite cervical dilation, Bishop score unfavorable. PV cytotec  placed. Saline lock and labor diet 2) GBS+: PCN  3) GHTN: criteria met by prenatal visit and MAU visit, reviewed with patient  CMP and Pr/Cr added to admit labs  Expecting baby boy Jody Reed Jody Reed Jody Reed Jody Reed 11/09/2024, 3:33 AM

## 2024-11-09 NOTE — Anesthesia Preprocedure Evaluation (Signed)
Anesthesia Evaluation  Patient identified by MRN, date of birth, ID band Patient awake    Reviewed: Allergy & Precautions, H&P , NPO status , Patient's Chart, lab work & pertinent test results  Airway Mallampati: II  TM Distance: >3 FB Neck ROM: Full    Dental no notable dental hx.    Pulmonary neg pulmonary ROS   Pulmonary exam normal breath sounds clear to auscultation       Cardiovascular negative cardio ROS Normal cardiovascular exam Rhythm:Regular Rate:Normal     Neuro/Psych negative neurological ROS  negative psych ROS   GI/Hepatic negative GI ROS, Neg liver ROS,,,  Endo/Other  negative endocrine ROS    Renal/GU negative Renal ROS  negative genitourinary   Musculoskeletal negative musculoskeletal ROS (+)    Abdominal  (+) + obese  Peds negative pediatric ROS (+)  Hematology negative hematology ROS (+)   Anesthesia Other Findings   Reproductive/Obstetrics (+) Pregnancy                             Anesthesia Physical Anesthesia Plan  ASA: 2  Anesthesia Plan: Epidural   Post-op Pain Management:    Induction:   PONV Risk Score and Plan:   Airway Management Planned:   Additional Equipment:   Intra-op Plan:   Post-operative Plan:   Informed Consent:   Plan Discussed with:   Anesthesia Plan Comments:        Anesthesia Quick Evaluation  

## 2024-11-09 NOTE — Lactation Note (Signed)
 This note was copied from a baby's chart. Lactation Consultation Note  Patient Name: Jody Reed Unijb'd Date: 11/09/2024 Age:26 hours Reason for consult: Initial assessment;1st time breastfeeding;Term.  P1, term female infant, Per MOB, infant is breastfeeding well, breastfeeding 8 minutes most feeding, has latched 3X and had one void and one stool diaper. MOB does not have any questions or concerns for LC at this time. MOB knows to call if she need latch assistance. MOB will continue to breastfeed infant by cues, on demand, 8-12 times within 24 hours, skin to skin. LC discussed the importance of maternal rest, meals and hydration. LC sent referral for STORK DEBP MOB has insurance with [facility] but does not want Medela products. MOB was  made aware of O/P services, breastfeeding support groups, community resources, and our phone # for post-discharge questions.    Maternal Data Has patient been taught Hand Expression?: Yes Does the patient have breastfeeding experience prior to this delivery?: No  Feeding Mother's Current Feeding Choice: Breast Milk  LATCH Score  Per MOB, infant breastfeed for 8 minutes prior to [facility] entering the room.                  Lactation Tools Discussed/Used    Interventions Interventions: Breast feeding basics reviewed;Skin to skin;Hand express;Education;Guidelines for Milk Supply and Pumping Schedule Handout;LC Services brochure;CDC milk storage guidelines;CDC Guidelines for Breast Pump Cleaning  Discharge Pump: Referral sent for [facility] Pump  Consult Status Consult Status: Follow-up Date: 11/10/24 Follow-up type: In-patient    [name] Batter 11/09/2024, 10:40 PM

## 2024-11-09 NOTE — Anesthesia Procedure Notes (Signed)
 Epidural Patient location during procedure: OB Start time: 11/09/2024 12:05 PM End time: 11/09/2024 12:21 PM  Staffing Anesthesiologist: Cleotilde Butler Dade, MD Performed: anesthesiologist   Preanesthetic Checklist Completed: patient identified, IV checked, site marked, risks and benefits discussed, surgical consent, monitors and equipment checked, pre-op evaluation and timeout performed  Epidural Patient position: sitting Prep: ChloraPrep Patient monitoring: heart rate, cardiac monitor, continuous pulse ox and blood pressure Approach: midline Location: L2-L3 Injection technique: LOR saline  Needle:  Needle type: Tuohy  Needle gauge: 17 G Needle length: 9 cm Needle insertion depth: 8 cm Catheter type: closed end flexible Catheter size: 20 Guage Catheter at skin depth: 12 cm Test dose: negative  Assessment Events: blood not aspirated, injection not painful, no injection resistance, no paresthesia and negative IV test  Additional Notes Reason for block:procedure for pain

## 2024-11-09 NOTE — Progress Notes (Signed)
 Jody Reed is a 24 y.o. G1P0 at [redacted]w[redacted]d   Subjective: Pt feels well with mild contractions appreciated. She has no complaints. No HA or visual change  Objective: BP (!) 142/75   Pulse 87   Temp 97.8 F (36.6 C) (Oral)   Resp 15   Ht 5' 6 (1.676 m)   Wt 111.9 kg   SpO2 100%   BMI 39.80 kg/m  No intake/output data recorded. No intake/output data recorded.  FHT:  FHR: 130 bpm, variability: moderate,  accelerations:  Present,  decelerations:  Absent UC:   regular, every 1-3 minutes SVE:   Dilation: 4.5 Effacement (%): 70 Station: -2 Exam by:: Camden Knotek  Labs: Lab Results  Component Value Date   WBC 10.1 11/09/2024   HGB 11.6 (L) 11/09/2024   HCT 34.1 (L) 11/09/2024   MCV 91.4 11/09/2024   PLT 268 11/09/2024    Assessment / Plan: Induction of labor due to gestational hypertension,  progressing well on pitocin   Labor: Now on . AROM done with moderate return of clear fluid.  Preeclampsia:  no signs or symptoms of toxicity and labs stable Fetal Wellbeing:  Category I Pain Control:  Labor support without medications; open to iv meds or epidural  I/D:  n/a Anticipated MOD:  NSVD  Ted LELON Solo, DO 11/09/2024, 10:48 AM

## 2024-11-09 NOTE — Progress Notes (Signed)
 Patient ID: Jody Reed, female   DOB: 11-May-2001, 24 y.o.   MRN: [id number] Pt comfortable with epidural.  No complaints VSS EFM - 145, mod variability, + accels, occ variables, cat 2 TOCO - ctxs q 1-28mins  SVE - 9/100/-1  A/P: Progressing well in labor          Expectant mgmt          Anticipate svd

## 2024-11-10 LAB — CBC
HCT: 28 % — ABNORMAL LOW (ref 36.0–46.0)
Hemoglobin: 9.5 g/dL — ABNORMAL LOW (ref 12.0–15.0)
MCH: 30.4 pg (ref 26.0–34.0)
MCHC: 33.9 g/dL (ref 30.0–36.0)
MCV: 89.5 fL (ref 80.0–100.0)
Platelets: 207 K/uL (ref 150–400)
RBC: 3.13 MIL/uL — ABNORMAL LOW (ref 3.87–5.11)
RDW: 13 % (ref 11.5–15.5)
WBC: 12 K/uL — ABNORMAL HIGH (ref 4.0–10.5)
nRBC: 0 % (ref 0.0–0.2)

## 2024-11-10 NOTE — Progress Notes (Signed)
 Patient is doing well.  She is ambulating, voiding, tolerating PO.  Pain control is good.  Lochia is appropriate  Vitals:   11/09/24 2132 11/09/24 2225 11/10/24 0102 11/10/24 0543  BP: (!) 140/72 139/78 (!) 143/83 123/78  Pulse: 84 96 93 86  Resp: 18  18 18   Temp: 99.2 F (37.3 C)  98.2 F (36.8 C) 97.7 F (36.5 C)  TempSrc: Oral  Oral Oral  SpO2:    99%  Weight:      Height:        NAD Fundus firm Ext: 2+ edema to mid-calf  Lab Results  Component Value Date   WBC 12.0 (H) 11/10/2024   HGB 9.5 (L) 11/10/2024   HCT 28.0 (L) 11/10/2024   MCV 89.5 11/10/2024   PLT 207 11/10/2024    --/--/A POS (01/13 0248)  A/P 23 y.o. G1P1001 PPD#1 s/p TSVD. Routine postpartum care.   GHTN:  BPs intermittently elevated since delivery, ranging 120s-140s systolic.  Will monitor q4hr today and if consistently > 140/90, will initiate anti-hypertensive therapy  Circumcision:  desires circumcision for infant but undecided if they want to do it here or as an outpatient.  At this time, they have declined vitamin K.  Weltha is aware that vitamin K is required for an inpatient circumcision.  Also review recommendation to receive even if circ not performed inpatient.   Nurse to alert me if patient elects for inpatient circumcision; however, at this time, baby has been very spitty and has not fed well since delivery.   May not be cleared until tomorrow Expect d/c 1-2 days pending BP course  Renesha Lizama GEFFEL Kalyiah Saintil

## 2024-11-10 NOTE — Anesthesia Postprocedure Evaluation (Signed)
"   Anesthesia Post Note  Patient: Shameca A Percival  Procedure(s) Performed: AN AD HOC LABOR EPIDURAL     Patient location during evaluation: Mother Baby Anesthesia Type: Epidural Level of consciousness: awake and alert Pain management: pain level controlled Vital Signs Assessment: post-procedure vital signs reviewed and stable Respiratory status: spontaneous breathing, nonlabored ventilation and respiratory function stable Cardiovascular status: stable Postop Assessment: no headache, no backache and epidural receding Anesthetic complications: no   No notable events documented.  Last Vitals:  Vitals:   11/10/24 0102 11/10/24 0543  BP: (!) 143/83 123/78  Pulse: 93 86  Resp: 18 18  Temp: 36.8 C 36.5 C  SpO2:  99%    Last Pain:  Vitals:   11/10/24 0545  TempSrc:   PainSc: 0-No pain   Pain Goal:                   Aksel Bencomo      "

## 2024-11-10 NOTE — Lactation Note (Signed)
 This note was copied from a baby's chart. Lactation Consultation Note  Patient Name: Jody Reed Unijb'd Date: 11/10/2024 Age:26 hours Reason for consult: 1st time breastfeeding;Term;Follow-up assessment (Infant weight loss -2.55%).  P1, MOB latched infant on her left breast with pillow support using the cross cradle hold, infant sustained his latch and MOB was still breastfeeding past 10 minutes when LC left the room. MOB informed LC infant has been latching well today, infant is cluster feeding and most feedings are 30 to 40 minutes per feeding. MOB will continue to breastfeed infant by cues, on demand, 8-12 times within 24 hours, skin to skin. LC reinforced importance of maternal rest, meals and hydration.   Maternal Data    Feeding Mother's Current Feeding Choice: Breast Milk  LATCH Score Latch: Grasps breast easily, tongue down, lips flanged, rhythmical sucking.  Audible Swallowing: Spontaneous and intermittent  Type of Nipple: Everted at rest and after stimulation  Comfort (Breast/Nipple): Soft / non-tender  Hold (Positioning): Assistance needed to correctly position infant at breast and maintain latch.  LATCH Score: 9   Lactation Tools Discussed/Used    Interventions Interventions: Assisted with latch;Skin to skin;Support pillows;Position options;Adjust position;Breast compression;Education  Discharge    Consult Status Consult Status: Follow-up Date: 11/11/24 Follow-up type: In-patient    [name] Batter 11/10/2024, 9:55 PM

## 2024-11-11 LAB — BIRTH TISSUE RECOVERY COLLECTION (PLACENTA DONATION)

## 2024-11-11 MED ORDER — ACETAMINOPHEN 325 MG PO TABS: 650.0000 mg | ORAL_TABLET | ORAL | Status: AC | PRN

## 2024-11-11 MED ORDER — NIFEDIPINE ER 60 MG PO TB24: 60.0000 mg | ORAL_TABLET | Freq: Every day | ORAL | 11 refills | Status: AC

## 2024-11-11 MED ORDER — NIFEDIPINE ER OSMOTIC RELEASE 30 MG PO TB24
30.0000 mg | ORAL_TABLET | Freq: Once | ORAL | Status: AC
  Administered 2024-11-11: 30 mg via ORAL
  Filled 2024-11-11: qty 1

## 2024-11-11 MED ORDER — NIFEDIPINE ER OSMOTIC RELEASE 30 MG PO TB24
30.0000 mg | ORAL_TABLET | Freq: Every day | ORAL | Status: DC
  Administered 2024-11-11: 30 mg via ORAL
  Filled 2024-11-11: qty 1

## 2024-11-11 MED ORDER — IBUPROFEN 600 MG PO TABS: 600.0000 mg | ORAL_TABLET | Freq: Four times a day (QID) | ORAL | 1 refills | Status: AC | PRN

## 2024-11-11 NOTE — Lactation Note (Signed)
 This note was copied from a baby's chart. Lactation Consultation Note  Patient Name: Jody Reed Unijb'd Date: 11/11/2024 Age:26 hours, P1  Reason for consult: Follow-up assessment;Primapara;1st time breastfeeding;Infant weight loss;Term (7 % weight loss,) Per mom the last time baby fed was 6 am for 40 mins with swallows.  LC reviewed breast feeding goals for 24 hours - feed with cues and by 3 hours STS until the baby is back to birth weight.  LC reviewed the doc flow sheets and the weight  loss correlates with with the 7 % weight loss ( shared with parents)  LC reviewed breast feeding D/C teaching and the [facility] resources.  LC stressed the importance of engorgement prevention and tx.  Mom has a hand pump for D/C and is waiting on her Stork pump.  Referral has been sent and the parents are aware the [facility] will F/U.   Maternal Data Has patient been taught Hand Expression?: Yes Does the patient have breastfeeding experience prior to this delivery?: No  Feeding Mother's Current Feeding Choice: Breast Milk  LATCH Score - 9     Lactation Tools Discussed/Used Tools: Pump Breast pump type: Manual;Other (comment) (hand pump has been provided from staff) Pump Education: Milk Storage;Setup, frequency, and cleaning  Interventions Interventions: Breast feeding basics reviewed;Hand pump;Education;LC Services brochure;CDC milk storage guidelines;CDC Guidelines for Breast Pump Cleaning  Discharge Pump: Referral sent for Stork Pump;Manual;Personal (this am and message has been sent for need of Tere, Scan to [employer] email for the 2nd and 3rd time , letting them know the insurance and D/C this am.) [facility] Program: No  Consult Status Consult Status: Complete Date: 11/11/24    Rollene Caldron Anari Evitt 11/11/2024, 10:15 AM

## 2024-11-11 NOTE — Progress Notes (Signed)
 Patient is doing well.  She is ambulating, voiding, tolerating PO.  Pain control is good.  Lochia is appropriate No headache, vision change, RUQ pain  Vitals:   11/10/24 2300 11/11/24 0300 11/11/24 0810 11/11/24 1237  BP: (!) 154/85 121/73 (!) 142/91 128/76  Pulse: 86 82 96 84  Resp: 18 17 17    Temp: 98 F (36.7 C) 98.1 F (36.7 C) 97.7 F (36.5 C)   TempSrc: Oral Oral Oral   SpO2: 99% 99% 99%   Weight:      Height:        NAD Fundus firm Ext: 2+ edema to mid-calf  Lab Results  Component Value Date   WBC 12.0 (H) 11/10/2024   HGB 9.5 (L) 11/10/2024   HCT 28.0 (L) 11/10/2024   MCV 89.5 11/10/2024   PLT 207 11/10/2024    --/--/A POS (01/13 0248)  A/P 23 y.o. G1P1001 PPD#2 s/p TSVD. Routine postpartum care.   GHTN:  started on Procardia  XL 30mg  overnight with initial good response. BP this AM 142/91, given additional dose of Procardia  XL 30mg . Now normotensive. Will discharge on Procardia  XL 60mg , home BP monitoring (patient has cuff) and office BP check early next week.  Dispo: ready for discharge, instructions and call parameters reviewed  Jody Reed Chapel

## 2024-11-11 NOTE — Lactation Note (Signed)
 This note was copied from a baby's chart. Lactation Consultation Note  Patient Name: Boy Mariona Scholes Unijb'd Date: 11/11/2024 Age:26 hours Reason for consult:  (received the [facility] pump - Spectra )   Discharge Pump: Received Stork Pump [facility] Program: No  Consult Status Consult Status: Complete Date: 11/11/24    Rollene Jenkins Fiedler 11/11/2024, 11:11 AM

## 2024-11-11 NOTE — Discharge Summary (Signed)
 "    Postpartum Discharge Summary  Date of Service updated 11/11/24      Patient Name: Jody Reed DOB: 11-20-2000 MRN: [id number]  Date of admission: 11/09/2024 Delivery date:11/09/2024 Delivering provider: DELANA TED MORRISON Date of discharge: 11/11/2024  Admitting diagnosis: Pregnant and not yet delivered in third trimester [Z34.93] Intrauterine pregnancy: [redacted]w[redacted]d     Secondary diagnosis:  Principal Problem:   Pregnant and not yet delivered in third trimester  Additional problems: GHTN    Discharge diagnosis: Term Pregnancy Delivered and Gestational Hypertension                                              Post partum procedures:none Augmentation: AROM, Pitocin , and Cytotec  Complications: None  Hospital course: Induction of Labor With Vaginal Delivery   24 y.o. yo G1P1001 at [redacted]w[redacted]d was admitted to the hospital 11/09/2024 for induction of labor.  Indication for induction: Gestational hypertension and Elective.  Patient had an labor course complicated by nothing Membrane Rupture Time/Date: 10:40 AM,11/09/2024  Delivery Method:Vaginal, Spontaneous Operative Delivery:N/A Episiotomy: None Lacerations:  2nd degree;Perineal Details of delivery can be found in separate delivery note.  Patient had a postpartum course complicated by mildly elevated blood pressures. She was started on Procardia  XL and titrated to 60mg  daily. Patient is discharged home 11/11/24.  Newborn Data: Birth date:11/09/2024 Birth time:5:35 PM Gender:Female Living status:Living Apgars:8 ,9  Weight:3730 g  Magnesium Sulfate received: No BMZ received: No Rhophylac:N/A MMR: ordered T-DaP:Given prenatally Flu: No RSV Vaccine received: No Transfusion:No Immunizations administered: Immunization History  Administered Date(s) Administered   DTaP 06/18/1999, 08/20/1999, 10/12/1999, 08/05/2000, 04/16/2004   HIB (PRP-OMP) 06/18/1999, 08/20/1999, 10/12/1999, 08/05/2000   HPV Quadrivalent 07/12/2008, 07/05/2009,  04/04/2010   Hepatitis A 02/14/2006, 10/06/2006   Hepatitis B 01/22/99, 06/18/1999, 01/09/2000   IPV 06/18/1999, 08/20/1999, 01/09/2000, 04/16/2004   Influenza Nasal 07/05/2009, 08/02/2010, 06/21/2011, 08/04/2012   Influenza,Quad,Nasal, Live 08/04/2013, 08/29/2014   MMR 04/15/2000, 04/16/2004   Meningococcal Conjugate 04/04/2010, 05/08/2015   Pneumococcal Conjugate-13 06/18/1999, 08/20/1999, 10/12/1999, 04/15/2000   Tdap 04/04/2010   Varicella 04/15/2000, 06/03/2006    Physical exam  Vitals:   11/10/24 2300 11/11/24 0300 11/11/24 0810 11/11/24 1237  BP: (!) 154/85 121/73 (!) 142/91 128/76  Pulse: 86 82 96 84  Resp: 18 17 17    Temp: 98 F (36.7 C) 98.1 F (36.7 C) 97.7 F (36.5 C)   TempSrc: Oral Oral Oral   SpO2: 99% 99% 99%   Weight:      Height:       General: alert, cooperative, and no distress Lochia: appropriate Uterine Fundus: firm Incision: N/A DVT Evaluation: No evidence of DVT seen on physical exam. Labs: Lab Results  Component Value Date   WBC 12.0 (H) 11/10/2024   HGB 9.5 (L) 11/10/2024   HCT 28.0 (L) 11/10/2024   MCV 89.5 11/10/2024   PLT 207 11/10/2024      Latest Ref Rng & Units 11/09/2024    3:00 AM  CMP  Glucose 70 - 99 mg/dL 88   BUN 6 - 20 mg/dL 8   Creatinine 9.55 - 8.99 mg/dL 9.26   Sodium 864 - 854 mmol/L 137   Potassium 3.5 - 5.1 mmol/L 3.9   Chloride 98 - 111 mmol/L 106   CO2 22 - 32 mmol/L 19   Calcium 8.9 - 10.3 mg/dL 9.0   Total Protein 6.5 - 8.1  g/dL 6.2   Total Bilirubin 0.0 - 1.2 mg/dL 0.2   Alkaline Phos 38 - 126 U/L 188   AST 15 - 41 U/L 21   ALT 0 - 44 U/L 13    Edinburgh Score:     No data to display            After visit meds:  Allergies as of 11/11/2024   No Known Allergies      Medication List     TAKE these medications    acetaminophen  325 MG tablet Commonly known as: Tylenol  Take 2 tablets (650 mg total) by mouth every 4 (four) hours as needed (for pain scale < 4).   ibuprofen  600 MG  tablet Commonly known as: ADVIL  Take 1 tablet (600 mg total) by mouth every 6 (six) hours as needed for moderate pain (pain score 4-6) or cramping.   NIFEdipine  60 MG 24 hr tablet Commonly known as: ADALAT  CC Take 1 tablet (60 mg total) by mouth daily. Start taking on: November 12, 2024   PRENATAL 1 PO Take by mouth.         Discharge home in stable condition Infant Feeding: Breast Infant Disposition:home with mother Discharge instruction: per After Visit Summary and Postpartum booklet. Activity: Advance as tolerated. Pelvic rest for 6 weeks.  Diet: routine diet Anticipated Birth Control: Unsure Postpartum Appointment:4 weeks Additional Postpartum F/U: BP check 1 week Future Appointments:No future appointments. Follow up Visit:  Follow-up Information     Ob/Gyn, Landy Stains. Schedule an appointment as soon as possible for a visit in 1 week(s).   Why: Blood pressure check Contact information: [address] [place] [address] [phone]                     11/11/2024 Jody Reed Chapel, MD   "

## 2024-11-16 ENCOUNTER — Inpatient Hospital Stay (HOSPITAL_COMMUNITY)

## 2024-11-19 ENCOUNTER — Telehealth (HOSPITAL_COMMUNITY): Payer: Self-pay | Admitting: *Deleted

## 2024-11-19 NOTE — Telephone Encounter (Signed)
 11/19/2024  Name: BRYSTAL KILDOW MRN: [id number] DOB: 09/20/2001  Reason for Call:  Transition of Care Hospital Discharge Call  Contact Status: Patient Contact Status: Complete  Language assistant needed: Interpreter Mode: Interpreter Not Needed        Follow-Up Questions: Do You Have Any Concerns About Your Health As You Heal From Delivery?: No Do You Have Any Concerns About Your Infants Health?: No  Edinburgh Postnatal Depression Scale:  In the Past 7 Days:    PHQ2-9 Depression Scale:     Discharge Follow-up: Edinburgh score requires follow up?:  (Patient says answers are the same as in the hospital when score was 0. Patient endorses she is tired due to newborn care demands, but she is doing well emotionally.) Patient was advised of the following resources:: Support Group, Breastfeeding Support Group, Lactation  Post-discharge interventions: Reviewed Newborn Safe Sleep Practices  Mliss Sieve, RN 11/19/2024 11:24
